# Patient Record
Sex: Male | Born: 1960 | Race: White | Hispanic: No | Marital: Married | Smoking: Former smoker
Health system: Southern US, Community
[De-identification: ages and names within clinical notes are randomized; demographics above are authoritative.]

## PROBLEM LIST (undated history)

## (undated) DIAGNOSIS — I10 Essential (primary) hypertension: Secondary | ICD-10-CM

## (undated) HISTORY — PX: APPENDECTOMY: SHX54

## (undated) HISTORY — PX: EYE SURGERY: SHX253

---

## 2014-12-02 ENCOUNTER — Encounter (INDEPENDENT_AMBULATORY_CARE_PROVIDER_SITE_OTHER): Payer: Self-pay | Admitting: *Deleted

## 2014-12-18 ENCOUNTER — Other Ambulatory Visit (INDEPENDENT_AMBULATORY_CARE_PROVIDER_SITE_OTHER): Payer: Self-pay | Admitting: *Deleted

## 2014-12-18 ENCOUNTER — Encounter (INDEPENDENT_AMBULATORY_CARE_PROVIDER_SITE_OTHER): Payer: Self-pay | Admitting: *Deleted

## 2014-12-18 DIAGNOSIS — Z1211 Encounter for screening for malignant neoplasm of colon: Secondary | ICD-10-CM

## 2015-01-11 ENCOUNTER — Telehealth (INDEPENDENT_AMBULATORY_CARE_PROVIDER_SITE_OTHER): Payer: Self-pay | Admitting: *Deleted

## 2015-01-11 NOTE — Telephone Encounter (Signed)
Patient needs suprep 

## 2015-01-12 MED ORDER — SUPREP BOWEL PREP KIT 17.5-3.13-1.6 GM/177ML PO SOLN: 1.0000 | Freq: Once | ORAL | Status: DC

## 2015-02-09 ENCOUNTER — Telehealth (INDEPENDENT_AMBULATORY_CARE_PROVIDER_SITE_OTHER): Payer: Self-pay | Admitting: *Deleted

## 2015-02-09 NOTE — Telephone Encounter (Signed)
Referring MD/PCP: howard   Procedure: tcs  Reason/Indication:  screening  Has patient had this procedure before?  no  If so, when, by whom and where?    Is there a family history of colon cancer?  no  Who?  What age when diagnosed?    Is patient diabetic?   no      Does patient have prosthetic heart valve?  no  Do you have a pacemaker?  no  Has patient ever had endocarditis? no  Has patient had joint replacement within last 12 months?  no  Does patient tend to be constipated or take laxatives? no  Does patient have a history of alcohol/drug use? o  Is patient on Coumadin, Plavix and/or Aspirin? no  Medications: lisinopril 10 mg daily, fildenafil 20 mg prn, tamsulosin 0.4 mg daily  Allergies: asa  Medication Adjustment:   Procedure date & time: 03/10/15 at 930

## 2015-02-11 NOTE — Telephone Encounter (Signed)
agree

## 2015-03-10 ENCOUNTER — Encounter (HOSPITAL_COMMUNITY): Payer: Self-pay | Admitting: *Deleted

## 2015-03-10 ENCOUNTER — Ambulatory Visit (HOSPITAL_COMMUNITY)
Admission: RE | Admit: 2015-03-10 | Discharge: 2015-03-10 | Disposition: A | Discharge status: Home / Self Care | Payer: No Typology Code available for payment source | Source: Ambulatory Visit | Attending: Internal Medicine | Admitting: Internal Medicine

## 2015-03-10 ENCOUNTER — Encounter (HOSPITAL_COMMUNITY)
Admission: RE | Disposition: A | Discharge status: Home / Self Care | Payer: Self-pay | Source: Ambulatory Visit | Attending: Internal Medicine

## 2015-03-10 DIAGNOSIS — K644 Residual hemorrhoidal skin tags: Secondary | ICD-10-CM | POA: Insufficient documentation

## 2015-03-10 DIAGNOSIS — I10 Essential (primary) hypertension: Secondary | ICD-10-CM | POA: Insufficient documentation

## 2015-03-10 DIAGNOSIS — Z808 Family history of malignant neoplasm of other organs or systems: Secondary | ICD-10-CM | POA: Insufficient documentation

## 2015-03-10 DIAGNOSIS — K648 Other hemorrhoids: Secondary | ICD-10-CM | POA: Diagnosis not present

## 2015-03-10 DIAGNOSIS — Z79899 Other long term (current) drug therapy: Secondary | ICD-10-CM | POA: Diagnosis not present

## 2015-03-10 DIAGNOSIS — K5730 Diverticulosis of large intestine without perforation or abscess without bleeding: Secondary | ICD-10-CM | POA: Insufficient documentation

## 2015-03-10 DIAGNOSIS — Z1211 Encounter for screening for malignant neoplasm of colon: Secondary | ICD-10-CM | POA: Diagnosis not present

## 2015-03-10 DIAGNOSIS — D125 Benign neoplasm of sigmoid colon: Secondary | ICD-10-CM | POA: Diagnosis not present

## 2015-03-10 HISTORY — DX: Essential (primary) hypertension: I10

## 2015-03-10 HISTORY — PX: COLONOSCOPY: SHX5424

## 2015-03-10 SURGERY — COLONOSCOPY
Anesthesia: Moderate Sedation

## 2015-03-10 MED ORDER — MEPERIDINE HCL 50 MG/ML IJ SOLN
INTRAMUSCULAR | Status: DC | PRN
  Administered 2015-03-10 (×2): 25 mg via INTRAVENOUS

## 2015-03-10 MED ORDER — MIDAZOLAM HCL 5 MG/5ML IJ SOLN
INTRAMUSCULAR | Status: AC
  Filled 2015-03-10: qty 10

## 2015-03-10 MED ORDER — SODIUM CHLORIDE 0.9 % IV SOLN
INTRAVENOUS | Status: DC
  Administered 2015-03-10: 1000 mL via INTRAVENOUS

## 2015-03-10 MED ORDER — SIMETHICONE 40 MG/0.6ML PO SUSP
ORAL | Status: DC | PRN
  Administered 2015-03-10: 09:00:00

## 2015-03-10 MED ORDER — MIDAZOLAM HCL 5 MG/5ML IJ SOLN
INTRAMUSCULAR | Status: DC | PRN
  Administered 2015-03-10 (×4): 2 mg via INTRAVENOUS

## 2015-03-10 MED ORDER — MEPERIDINE HCL 50 MG/ML IJ SOLN
INTRAMUSCULAR | Status: AC
  Filled 2015-03-10: qty 1

## 2015-03-10 NOTE — H&P (Signed)
Peter Underwood. is an 54 y.o. male.   Chief Complaint: Patient is here for colonoscopy. HPI: Patient is 54 year old Caucasian male who is here for screening colonoscopy. He denies abdominal pain change in bowel habits rectal bleeding. This is patient's first exam. Family history is negative for CRC.  Past Medical History  Diagnosis Date  . Hypertension     Past Surgical History  Procedure Laterality Date  . Appendectomy      Family History  Problem Relation Age of Onset  . Hypertension Mother   . Alzheimer's disease Mother   . Diabetes Father   . Hypertension Father   . Arthritis Father   . Brain cancer Brother    Social History:  reports that he has quit smoking. He uses smokeless tobacco. He reports that he drinks alcohol. He reports that he does not use illicit drugs.  Allergies: No Known Allergies  Medications Prior to Admission  Medication Sig Dispense Refill  . lisinopril (PRINIVIL,ZESTRIL) 10 MG tablet Take 10 mg by mouth daily.  1  . SUPREP BOWEL PREP SOLN Take 1 kit by mouth once. 1 Bottle 0  . tamsulosin (FLOMAX) 0.4 MG CAPS capsule Take 0.4 mg by mouth daily.  4    No results found for this or any previous visit (from the past 48 hour(s)). No results found.  ROS  Blood pressure 113/72, pulse 66, temperature 98.2 F (36.8 C), temperature source Oral, resp. rate 11, height _0  (1.778 m), weight 220 lb (99.791 kg), SpO2 100 %. Physical Exam  Constitutional: He appears well-developed and well-nourished.  HENT:  Mouth/Throat: Oropharynx is clear and moist.  Eyes: Conjunctivae are normal. No scleral icterus.  Neck: No thyromegaly present.  Cardiovascular: Normal rate, regular rhythm and normal heart sounds.   No murmur heard. Respiratory: Effort normal and breath sounds normal.  GI: Soft. He exhibits no distension and no mass. There is no tenderness.  Musculoskeletal: He exhibits no edema.  Lymphadenopathy:    He has no cervical adenopathy.   Neurological: He is alert.  Skin: Skin is warm and dry.     Assessment/Plan Average risk screening colonoscopy.  REHMAN,NAJEEB U 03/10/2015, 9:20 AM

## 2015-03-10 NOTE — Op Note (Signed)
COLONOSCOPY PROCEDURE REPORT  PATIENT:  Peter Underwood.  MR#:  [id number] Birthdate:  January 19, 1961, 54 y.o., male Endoscopist:  Dr. Rogene Houston, MD Referred By:  Dr. Rory Percy, MD  Procedure Date: 03/10/2015  Procedure:   Colonoscopy  Indications:  Average risk screening colonoscopy.  Informed Consent:  The procedure and risks were reviewed with the patient and informed consent was obtained.  Medications:  Demerol 50 mg IV Versed 8 mg IV  Description of procedure:  After a digital rectal exam was performed, that colonoscope was advanced from the anus through the rectum and colon to the area of the cecum, ileocecal valve and appendiceal orifice. The cecum was deeply intubated. These structures were well-seen and photographed for the record. From the level of the cecum and ileocecal valve, the scope was slowly and cautiously withdrawn. The mucosal surfaces were carefully surveyed utilizing scope tip to flexion to facilitate fold flattening as needed. The scope was pulled down into the rectum where a thorough exam including retroflexion was performed.  Findings:   Prep satisfactory. Small polyp cold snared from proximal sigmoid colon. Single small diverticulum at sigmoid colon. Normal rectal mucosa. Small hemorrhoids below the dentate line.  Therapeutic/Diagnostic Maneuvers Performed:  See above  Complications:  None  EBL: None  Cecal Withdrawal Time:  17 minutes  Impression:  Examination performed to cecum. Small polyp cold snared from proximal sigmoid colon. Single small diverticulum at sigmoid colon. Small external hemorrhoids  Recommendations:  Standard instructions given. I will contact patient with biopsy results and further recommendations.  REHMAN,NAJEEB U  03/10/2015 10:04 AM  CC: Dr. Rory Percy, MD & Dr. Rayne Du ref. provider found

## 2015-03-10 NOTE — Discharge Instructions (Signed)
Resume usual diet and medications. No driving for 24 hours. Physician will call with biopsy results.    Colonoscopy, Care After These instructions give you information on caring for yourself after your procedure. Your doctor may also give you more specific instructions. Call your doctor if you have any problems or questions after your procedure. HOME CARE  Do not drive for 24 hours.  Do not sign important papers or use machinery for 24 hours.  You may shower.  You may go back to your usual activities, but go slower for the first 24 hours.  Take rest breaks often during the first 24 hours.  Walk around or use warm packs on your belly (abdomen) if you have belly cramping or gas.  Drink enough fluids to keep your pee (urine) clear or pale yellow.  Resume your normal diet. Avoid heavy or fried foods.  Avoid drinking alcohol for 24 hours or as told by your doctor.  Only take medicines as told by your doctor. If a tissue sample (biopsy) was taken during the procedure:   Do not take aspirin or blood thinners for 7 days, or as told by your doctor.  Do not drink alcohol for 7 days, or as told by your doctor.  Eat soft foods for the first 24 hours. GET HELP IF: You still have a small amount of blood in your poop (stool) 2-3 days after the procedure. GET HELP RIGHT AWAY IF:  You have more than a small amount of blood in your poop.  You see clumps of tissue (blood clots) in your poop.  Your belly is puffy (swollen).  You feel sick to your stomach (nauseous) or throw up (vomit).  You have a fever.  You have belly pain that gets worse and medicine does not help. MAKE SURE YOU:  Understand these instructions.  Will watch your condition.  Will get help right away if you are not doing well or get worse. Document Released: 07/01/2010 Document Revised: 06/03/2013 Document Reviewed: 02/03/2013 [facility] Patient Information 2015 [place], [place]. This information is not intended  to replace advice given to you by your health care provider. Make sure you discuss any questions you have with your health care provider.   Hemorrhoids Hemorrhoids are swollen veins around the rectum or anus. There are two types of hemorrhoids:   Internal hemorrhoids. These occur in the veins just inside the rectum. They may poke through to the outside and become irritated and painful.  External hemorrhoids. These occur in the veins outside the anus and can be felt as a painful swelling or hard lump near the anus. CAUSES  Pregnancy.   Obesity.   Constipation or diarrhea.   Straining to have a bowel movement.   Sitting for long periods on the toilet.  Heavy lifting or other activity that caused you to strain.  Anal intercourse. SYMPTOMS   Pain.   Anal itching or irritation.   Rectal bleeding.   Fecal leakage.   Anal swelling.   One or more lumps around the anus.  DIAGNOSIS  Your caregiver may be able to diagnose hemorrhoids by visual examination. Other examinations or tests that may be performed include:   Examination of the rectal area with a gloved hand (digital rectal exam).   Examination of anal canal using a small tube (scope).   A blood test if you have lost a significant amount of blood.  A test to look inside the colon (sigmoidoscopy or colonoscopy). TREATMENT Most hemorrhoids can be treated at  home. However, if symptoms do not seem to be getting better or if you have a lot of rectal bleeding, your caregiver may perform a procedure to help make the hemorrhoids get smaller or remove them completely. Possible treatments include:   Placing a rubber band at the base of the hemorrhoid to cut off the circulation (rubber band ligation).   Injecting a chemical to shrink the hemorrhoid (sclerotherapy).   Using a tool to burn the hemorrhoid (infrared light therapy).   Surgically removing the hemorrhoid (hemorrhoidectomy).   Stapling the  hemorrhoid to block blood flow to the tissue (hemorrhoid stapling).  HOME CARE INSTRUCTIONS   Eat foods with fiber, such as whole grains, beans, nuts, fruits, and vegetables. Ask your doctor about taking products with added fiber in them (fibersupplements).  Increase fluid intake. Drink enough water and fluids to keep your urine clear or pale yellow.   Exercise regularly.   Go to the bathroom when you have the urge to have a bowel movement. Do not wait.   Avoid straining to have bowel movements.   Keep the anal area dry and clean. Use wet toilet paper or moist towelettes after a bowel movement.   Medicated creams and suppositories may be used or applied as directed.   Only take over-the-counter or prescription medicines as directed by your caregiver.   Take warm sitz baths for 15-20 minutes, 3-4 times a day to ease pain and discomfort.   Place ice packs on the hemorrhoids if they are tender and swollen. Using ice packs between sitz baths may be helpful.   Put ice in a plastic bag.   Place a towel between your skin and the bag.   Leave the ice on for 15-20 minutes, 3-4 times a day.   Do not use a donut-shaped pillow or sit on the toilet for long periods. This increases blood pooling and pain.  SEEK MEDICAL CARE IF:  You have increasing pain and swelling that is not controlled by treatment or medicine.  You have uncontrolled bleeding.  You have difficulty or you are unable to have a bowel movement.  You have pain or inflammation outside the area of the hemorrhoids. MAKE SURE YOU:  Understand these instructions.  Will watch your condition.  Will get help right away if you are not doing well or get worse. Document Released: 05/26/2000 Document Revised: 05/15/2012 Document Reviewed: 04/02/2012 [facility] Patient Information 2015 [place], [place]. This information is not intended to replace advice given to you by your health care provider. Make sure you discuss  any questions you have with your health care provider.   Colon Polyps Polyps are lumps of extra tissue growing inside the body. Polyps can grow in the large intestine (colon). Most colon polyps are noncancerous (benign). However, some colon polyps can become cancerous over time. Polyps that are larger than a pea may be harmful. To be safe, caregivers remove and test all polyps. CAUSES  Polyps form when mutations in the genes cause your cells to grow and divide even though no more tissue is needed. RISK FACTORS There are a number of risk factors that can increase your chances of getting colon polyps. They include:  Being older than 50 years.  Family history of colon polyps or colon cancer.  Long-term colon diseases, such as colitis or Crohn disease.  Being overweight.  Smoking.  Being inactive.  Drinking too much alcohol. SYMPTOMS  Most small polyps do not cause symptoms. If symptoms are present, they may include:  Blood in the stool. The stool may look dark red or black.  Constipation or diarrhea that lasts longer than 1 week. DIAGNOSIS People often do not know they have polyps until their caregiver finds them during a regular checkup. Your caregiver can use 4 tests to check for polyps:  Digital rectal exam. The caregiver wears gloves and feels inside the rectum. This test would find polyps only in the rectum.  Barium enema. The caregiver puts a liquid called barium into your rectum before taking X-rays of your colon. Barium makes your colon look white. Polyps are dark, so they are easy to see in the X-ray pictures.  Sigmoidoscopy. A thin, flexible tube (sigmoidoscope) is placed into your rectum. The sigmoidoscope has a light and tiny camera in it. The caregiver uses the sigmoidoscope to look at the last third of your colon.  Colonoscopy. This test is like sigmoidoscopy, but the caregiver looks at the entire colon. This is the most common method for finding and removing  polyps. TREATMENT  Any polyps will be removed during a sigmoidoscopy or colonoscopy. The polyps are then tested for cancer. PREVENTION  To help lower your risk of getting more colon polyps:  Eat plenty of fruits and vegetables. Avoid eating fatty foods.  Do not smoke.  Avoid drinking alcohol.  Exercise every day.  Lose weight if recommended by your caregiver.  Eat plenty of calcium and folate. Foods that are rich in calcium include milk, cheese, and broccoli. Foods that are rich in folate include chickpeas, kidney beans, and spinach. HOME CARE INSTRUCTIONS Keep all follow-up appointments as directed by your caregiver. You may need periodic exams to check for polyps. SEEK MEDICAL CARE IF: You notice bleeding during a bowel movement. Document Released: 02/23/2004 Document Revised: 08/21/2011 Document Reviewed: 08/08/2011 [facility] Patient Information 2015 [place], [place]. This information is not intended to replace advice given to you by your health care provider. Make sure you discuss any questions you have with your health care provider.

## 2015-03-17 ENCOUNTER — Encounter (HOSPITAL_COMMUNITY): Payer: Self-pay | Admitting: Internal Medicine

## 2018-02-15 ENCOUNTER — Encounter: Payer: Self-pay | Admitting: *Deleted

## 2018-02-18 ENCOUNTER — Encounter: Payer: Self-pay | Admitting: *Deleted

## 2018-02-18 ENCOUNTER — Ambulatory Visit (INDEPENDENT_AMBULATORY_CARE_PROVIDER_SITE_OTHER): Payer: No Typology Code available for payment source | Admitting: Cardiovascular Disease

## 2018-02-18 ENCOUNTER — Telehealth: Payer: Self-pay | Admitting: Cardiovascular Disease

## 2018-02-18 ENCOUNTER — Encounter: Payer: Self-pay | Admitting: Cardiovascular Disease

## 2018-02-18 VITALS — BP 118/80 | HR 81 | Ht 70.5 in | Wt 225.0 lb

## 2018-02-18 DIAGNOSIS — R55 Syncope and collapse: Secondary | ICD-10-CM | POA: Diagnosis not present

## 2018-02-18 DIAGNOSIS — R5383 Other fatigue: Secondary | ICD-10-CM | POA: Diagnosis not present

## 2018-02-18 DIAGNOSIS — R002 Palpitations: Secondary | ICD-10-CM | POA: Diagnosis not present

## 2018-02-18 NOTE — Addendum Note (Signed)
Addended by: Laurine Blazer on: 02/18/2018 11:05 AM   Modules accepted: Orders

## 2018-02-18 NOTE — Patient Instructions (Addendum)
Medication Instructions:  Continue all current medications.  Labwork: none  Testing/Procedures: Your physician has requested that you have an exercise stress myoview. For further information please visit [email or web address]. Please follow instruction sheet, as given.  Your physician has recommended that you wear a 30 day event monitor. Event monitors are medical devices that record the heart's electrical activity. Doctors most often [place] these monitors to diagnose arrhythmias. Arrhythmias are problems with the speed or rhythm of the heartbeat. The monitor is a small, portable device. You can wear one while you do your normal daily activities. This is usually used to diagnose what is causing palpitations/syncope (passing out).  Office will contact with results via phone or letter.    Follow-Up: 3 months or next available   Any Other Special Instructions Will Be Listed Below (If Applicable).  If you need a refill on your cardiac medications before your next appointment, please call your pharmacy.

## 2018-02-18 NOTE — Telephone Encounter (Signed)
°  Precert needed for: Exercise MV - near syncope, palp    Location: [facility]    Date:  Sept 13, 2019

## 2018-02-18 NOTE — Telephone Encounter (Signed)
°  Precert needed for:   *30 day event - near syncope, palp (PRE-CERT)   Location:     Date:

## 2018-02-18 NOTE — Progress Notes (Signed)
CARDIOLOGY CONSULT NOTE  Patient ID: Peter Underwood. MRN: [id number] DOB/AGE: 1960/08/23 57 y.o.  Admit date: (Not on file) Primary Physician: Rory Percy, MD Referring Physician: Ricardo Jericho PA  Reason for Consultation: Near syncope and palpitations  HPI: Peter Underwood. is a 57 y.o. male who is being seen today for the evaluation of near syncope and palpitations at the request of Lavella Lemons, [place].   I reviewed notes from his PCP.  There are comments about palpitations and near syncope.  He had some dizziness.  I personally reviewed an ECG recently performed by his PCP which demonstrated sinus rhythm with no ischemic ST segment or T wave abnormalities, nor any arrhythmias.  I reviewed labs dated 01/16/2018: Glucose 112, BUN 15, creatinine 0.93, sodium 142, potassium 4.8, white blood cells 5.6, hemoglobin 15.8, platelets 287, total cholesterol 178, triglycerides 132, HDL 31, LDL 121, hemoglobin A1c 6.9%.  He is here with his wife who is also my patient.  He tells me that symptoms have not been significant over the past 2 weeks.  He described an event 4 weeks ago when he was standing at work and not doing anything strenuous and suddenly felt like his face was hot.  He became dizzy and felt like his heart was racing for about 15 to 20 seconds.  His blood pressure was checked at work and was found to be okay.  He denies associated chest pain and shortness of breath.  He has felt mildly decreased energy levels over the past few years.  He has had occasional left arm pain but wonders if it is due to his left shoulder orthopedic issues.  He has noticed his blood pressure has been lower over the past 2 years.  He has cut back lisinopril to 2.5 mg daily on his own.  He is a Freight forwarder by profession.    No Known Allergies  Current Outpatient Medications  Medication Sig Dispense Refill  . dutasteride (AVODART) 0.5 MG capsule Take 0.5 mg by mouth daily.    Marland Kitchen lisinopril  (PRINIVIL,ZESTRIL) 10 MG tablet Take 2.5 mg by mouth daily.   1  . sildenafil (REVATIO) 20 MG tablet Take 20 mg by mouth.    . tamsulosin (FLOMAX) 0.4 MG CAPS capsule Take 0.4 mg by mouth daily.  4   No current facility-administered medications for this visit.     Past Medical History:  Diagnosis Date  . Hypertension     Past Surgical History:  Procedure Laterality Date  . APPENDECTOMY    . COLONOSCOPY N/A 03/10/2015   Procedure: COLONOSCOPY;  Surgeon: Rogene Houston, MD;  Location: [facility];  Service: Endoscopy;  Laterality: N/A;  930    Social History   Socioeconomic History  . Marital status: Married    Spouse name: Not on file  . Number of children: Not on file  . Years of education: Not on file  . Highest education level: Not on file  Occupational History  . Not on file  Social Needs  . Financial resource strain: Not on file  . Food insecurity:    Worry: Not on file    Inability: Not on file  . Transportation needs:    Medical: Not on file    Non-medical: Not on file  Tobacco Use  . Smoking status: Former Research scientist (life sciences)  . Smokeless tobacco: Current User  Substance and Sexual Activity  . Alcohol use: Yes    Comment: occasional beer  .  Drug use: No  . Sexual activity: Not on file  Lifestyle  . Physical activity:    Days per week: Not on file    Minutes per session: Not on file  . Stress: Not on file  Relationships  . Social connections:    Talks on phone: Not on file    Gets together: Not on file    Attends religious service: Not on file    Active member of club or organization: Not on file    Attends meetings of clubs or organizations: Not on file    Relationship status: Not on file  . Intimate partner violence:    Fear of current or ex partner: Not on file    Emotionally abused: Not on file    Physically abused: Not on file    Forced sexual activity: Not on file  Other Topics Concern  . Not on file  Social History Narrative  . Not on file      No family history of premature CAD in 1st degree relatives.  Current Meds  Medication Sig  . dutasteride (AVODART) 0.5 MG capsule Take 0.5 mg by mouth daily.  Marland Kitchen lisinopril (PRINIVIL,ZESTRIL) 10 MG tablet Take 2.5 mg by mouth daily.   . sildenafil (REVATIO) 20 MG tablet Take 20 mg by mouth.  . tamsulosin (FLOMAX) 0.4 MG CAPS capsule Take 0.4 mg by mouth daily.      Review of systems complete and found to be negative unless listed above in HPI    Physical exam Blood pressure 118/80, pulse 81, height 5' 10.5" (1.791 m), weight 225 lb (102.1 kg), SpO2 98 %. General: NAD Neck: No JVD, no thyromegaly or thyroid nodule.  Lungs: Clear to auscultation bilaterally with normal respiratory effort. CV: Nondisplaced PMI. Regular rate and rhythm, normal S1/S2, no S3/S4, no murmur.  No peripheral edema.  No carotid bruit.    Abdomen: Soft, nontender, no distention.  Skin: Intact without lesions or rashes.  Neurologic: Alert and oriented x 3.  Psych: Normal affect. Extremities: No clubbing or cyanosis.  HEENT: Normal.   ECG: Most recent ECG reviewed.   Labs: No results found for: K, BUN, CREATININE, ALT, TSH, HGB   Lipids: No results found for: LDLCALC, LDLDIRECT, CHOL, TRIG, HDL      ASSESSMENT AND PLAN:  1.  Near syncope and fatigue: Unclear etiology at this time.  Blood pressure is normal and he is only taking 2.5 mg of lisinopril.  I recommended he stop this as it is not likely causing any significant benefit.  I asked him to continue to monitor blood pressures at home.  I will obtain an exercise Myoview stress test for further clarification.  2.  Palpitations: I will obtain a 1 month event monitor.  3.  Hypertension: See discussion in #1.  I will have him stop lisinopril 2.5 mg daily and monitor blood pressures at home.   Disposition: Follow up in 3 months    Signed: Kate Sable, M.D., F.A.C.C.  02/18/2018, 10:19 AM

## 2018-02-22 ENCOUNTER — Ambulatory Visit (INDEPENDENT_AMBULATORY_CARE_PROVIDER_SITE_OTHER): Payer: No Typology Code available for payment source

## 2018-02-22 ENCOUNTER — Encounter (HOSPITAL_COMMUNITY)
Admission: RE | Admit: 2018-02-22 | Discharge: 2018-02-22 | Disposition: A | Discharge status: Home / Self Care | Payer: No Typology Code available for payment source | Source: Ambulatory Visit | Attending: Cardiovascular Disease | Admitting: Cardiovascular Disease

## 2018-02-22 ENCOUNTER — Encounter (HOSPITAL_BASED_OUTPATIENT_CLINIC_OR_DEPARTMENT_OTHER)
Admission: RE | Admit: 2018-02-22 | Discharge: 2018-02-22 | Disposition: A | Discharge status: Home / Self Care | Payer: No Typology Code available for payment source | Source: Ambulatory Visit | Attending: Cardiovascular Disease | Admitting: Cardiovascular Disease

## 2018-02-22 DIAGNOSIS — R002 Palpitations: Secondary | ICD-10-CM

## 2018-02-22 DIAGNOSIS — R55 Syncope and collapse: Secondary | ICD-10-CM | POA: Insufficient documentation

## 2018-02-22 DIAGNOSIS — R9439 Abnormal result of other cardiovascular function study: Secondary | ICD-10-CM | POA: Insufficient documentation

## 2018-02-22 LAB — NM MYOCAR MULTI W/SPECT W/WALL MOTION / EF
CHL RATE OF PERCEIVED EXERTION: 13
Estimated workload: 10.1 METS
Exercise duration (min): 9 min
Exercise duration (sec): 4 s
LHR: 0.32
LV dias vol: 92 mL (ref 62–150)
LVSYSVOL: 48 mL
MPHR: 162 {beats}/min
NUC STRESS TID: 0.8
Peak HR: 141 {beats}/min
Percent HR: 87 %
Rest HR: 70 {beats}/min
SDS: 5
SRS: 2
SSS: 7

## 2018-02-22 MED ORDER — SODIUM CHLORIDE 0.9% FLUSH
INTRAVENOUS | Status: AC
  Administered 2018-02-22: 10 mL via INTRAVENOUS
  Filled 2018-02-22: qty 10

## 2018-02-22 MED ORDER — REGADENOSON 0.4 MG/5ML IV SOLN
INTRAVENOUS | Status: AC
  Filled 2018-02-22: qty 5

## 2018-02-22 MED ORDER — TECHNETIUM TC 99M TETROFOSMIN IV KIT
10.0000 | PACK | Freq: Once | INTRAVENOUS | Status: AC | PRN
  Administered 2018-02-22: 10 via INTRAVENOUS

## 2018-02-22 MED ORDER — TECHNETIUM TC 99M TETROFOSMIN IV KIT
30.0000 | PACK | Freq: Once | INTRAVENOUS | Status: AC | PRN
  Administered 2018-02-22: 30.3 via INTRAVENOUS

## 2018-02-25 ENCOUNTER — Telehealth: Payer: Self-pay

## 2018-02-25 ENCOUNTER — Ambulatory Visit: Payer: No Typology Code available for payment source | Admitting: Cardiovascular Disease

## 2018-02-25 MED ORDER — ATORVASTATIN CALCIUM 40 MG PO TABS: 40.0000 mg | ORAL_TABLET | Freq: Every day | ORAL | 3 refills | Status: DC

## 2018-02-25 NOTE — Telephone Encounter (Signed)
-----   Message from Herminio Commons, MD sent at 02/22/2018  3:31 PM EDT ----- Stress test suggestive of a blockage. Start ASA 81 mg daily and atorvastatin 40 mg daily. Have him see me 4:20 on 10/1 to discuss next steps.

## 2018-02-25 NOTE — Telephone Encounter (Signed)
Pt notified of test results,f/u apt and to start ASA and Atorvastatin

## 2018-03-12 ENCOUNTER — Ambulatory Visit: Payer: No Typology Code available for payment source | Admitting: Cardiovascular Disease

## 2018-03-26 ENCOUNTER — Encounter: Payer: Self-pay | Admitting: Cardiovascular Disease

## 2018-03-26 ENCOUNTER — Ambulatory Visit: Payer: No Typology Code available for payment source | Admitting: Cardiovascular Disease

## 2018-03-26 VITALS — BP 120/82 | HR 90 | Ht 70.0 in | Wt 221.0 lb

## 2018-03-26 DIAGNOSIS — R5383 Other fatigue: Secondary | ICD-10-CM | POA: Diagnosis not present

## 2018-03-26 DIAGNOSIS — R55 Syncope and collapse: Secondary | ICD-10-CM

## 2018-03-26 DIAGNOSIS — R002 Palpitations: Secondary | ICD-10-CM

## 2018-03-26 DIAGNOSIS — R9439 Abnormal result of other cardiovascular function study: Secondary | ICD-10-CM | POA: Diagnosis not present

## 2018-03-26 NOTE — Patient Instructions (Signed)
Medication Instructions:  Your physician recommends that you continue on your current medications as directed. Please refer to the Current Medication list given to you today.   Labwork: none  Testing/Procedures: none  Follow-Up: Your physician recommends that you schedule a follow-up appointment in: [place]    Any Other Special Instructions Will Be Listed Below (If Applicable).     If you need a refill on your cardiac medications before your next appointment, please call your pharmacy.

## 2018-03-26 NOTE — Progress Notes (Signed)
SUBJECTIVE: The patient returns for follow-up after undergoing cardiovascular testing performed for the evaluation of near syncope and fatigue.  Nuclear stress test 02/22/2018 demonstrated a hypertensive response to exercise.  There was a defect in the mid inferior and apical inferior locations consistent with a small prior myocardial infarction with moderate peri-infarct ischemia, LVEF 48%.  It was deemed an intermediate risk study.  I had him start aspirin 81 mg and atorvastatin 40 mg.  He has been monitoring his blood pressures at home off of lisinopril and noticed that they were increasing and started taking 5 mg daily.  Since then his blood pressures have normalized.  He denies chest pain, palpitations, and exertional dyspnea.  He walked 2 miles last night without difficulty.  He denies any further near syncopal episodes.   He is here with his wife who is also my patient.  He is a Freight forwarder by profession.  Review of Systems: As per "subjective", otherwise negative.  No Known Allergies  Current Outpatient Medications  Medication Sig Dispense Refill  . atorvastatin (LIPITOR) 40 MG tablet Take 1 tablet (40 mg total) by mouth daily. 90 tablet 3  . dutasteride (AVODART) 0.5 MG capsule Take 0.5 mg by mouth daily.    Marland Kitchen lisinopril (PRINIVIL,ZESTRIL) 10 MG tablet Take 5 mg by mouth daily.   1  . sildenafil (REVATIO) 20 MG tablet Take 20 mg by mouth.    . tamsulosin (FLOMAX) 0.4 MG CAPS capsule Take 0.4 mg by mouth daily.  4   No current facility-administered medications for this visit.     Past Medical History:  Diagnosis Date  . Hypertension     Past Surgical History:  Procedure Laterality Date  . APPENDECTOMY    . COLONOSCOPY N/A 03/10/2015   Procedure: COLONOSCOPY;  Surgeon: Rogene Houston, MD;  [facility];  Service: Endoscopy;  Laterality: N/A;  930    Social History   Socioeconomic History  . Marital status: Married    Spouse name: Not on file  .  Number of children: Not on file  . Years of education: Not on file  . Highest education level: Not on file  Occupational History  . Not on file  Social Needs  . Financial resource strain: Not on file  . Food insecurity:    Worry: Not on file    Inability: Not on file  . Transportation needs:    Medical: Not on file    Non-medical: Not on file  Tobacco Use  . Smoking status: Former Research scientist (life sciences)  . Smokeless tobacco: Current User  Substance and Sexual Activity  . Alcohol use: Yes    Comment: occasional beer  . Drug use: No  . Sexual activity: Not on file  Lifestyle  . Physical activity:    Days per week: Not on file    Minutes per session: Not on file  . Stress: Not on file  Relationships  . Social connections:    Talks on phone: Not on file    Gets together: Not on file    Attends religious service: Not on file    Active member of club or organization: Not on file    Attends meetings of clubs or organizations: Not on file    Relationship status: Not on file  . Intimate partner violence:    Fear of current or ex partner: Not on file    Emotionally abused: Not on file    Physically abused: Not on file  Forced sexual activity: Not on file  Other Topics Concern  . Not on file  Social History Narrative  . Not on file     Vitals:   03/26/18 1540  BP: 120/82  Pulse: 90  SpO2: 96%  Weight: 221 lb (100.2 kg)  Height: 5\' 10"  (1.778 m)    Wt Readings from Last 3 Encounters:  03/26/18 221 lb (100.2 kg)  02/18/18 225 lb (102.1 kg)  03/10/15 220 lb (99.8 kg)     PHYSICAL EXAM General: NAD HEENT: Normal. Neck: No JVD, no thyromegaly. Lungs: Clear to auscultation bilaterally with normal respiratory effort. CV: Regular rate and rhythm, normal S1/S2, no S3/S4, no murmur. No pretibial or periankle edema.    Abdomen: Soft, nontender, no distention.  Neurologic: Alert and oriented.  Psych: Normal affect. Skin: Normal. Musculoskeletal: No gross deformities.    ECG:  Reviewed above under Subjective   Labs: No results found for: K, BUN, CREATININE, ALT, TSH, HGB   Lipids: No results found for: LDLCALC, LDLDIRECT, CHOL, TRIG, HDL     ASSESSMENT AND PLAN:  1.  Near syncope and fatigue: No symptom recurrence.  Nuclear stress test reviewed above suggestive of ischemic heart disease.  Currently on aspirin 81 mg and atorvastatin 40 mg.  I will continue to monitor symptoms.  If he develops worsening fatigue or has chest pain/shortness of breath, I would then arrange for coronary angiography.  2.  Palpitations: Awaiting results of 1 month event monitor.  3.  Hypertension: Blood pressure is normal.  Continue lisinopril 5 mg daily.   Disposition: Follow up 3 months in [facility] office   Kate Sable, M.D., F.A.C.C.

## 2018-04-25 ENCOUNTER — Telehealth: Payer: Self-pay | Admitting: *Deleted

## 2018-04-25 NOTE — Telephone Encounter (Signed)
Notes recorded by Laurine Blazer, LPN on 08/31/2246 at 9:27 AM EST Wife Manuela Schwartz) notified. Copy to pmd. Follow up scheduled for 07/2018 with Dr. Bronson Ing. ------  Notes recorded by Herminio Commons, MD on 04/24/2018 at 12:42 PM EST No significant arrhythmias.

## 2018-05-28 ENCOUNTER — Ambulatory Visit: Payer: No Typology Code available for payment source | Admitting: Cardiovascular Disease

## 2018-07-17 ENCOUNTER — Ambulatory Visit: Payer: No Typology Code available for payment source | Admitting: Cardiovascular Disease

## 2018-07-17 ENCOUNTER — Encounter: Payer: Self-pay | Admitting: Cardiovascular Disease

## 2018-07-17 VITALS — BP 114/73 | HR 67 | Ht 70.0 in | Wt 220.4 lb

## 2018-07-17 DIAGNOSIS — R55 Syncope and collapse: Secondary | ICD-10-CM

## 2018-07-17 DIAGNOSIS — R002 Palpitations: Secondary | ICD-10-CM

## 2018-07-17 DIAGNOSIS — R5383 Other fatigue: Secondary | ICD-10-CM

## 2018-07-17 DIAGNOSIS — R9439 Abnormal result of other cardiovascular function study: Secondary | ICD-10-CM | POA: Diagnosis not present

## 2018-07-17 DIAGNOSIS — E119 Type 2 diabetes mellitus without complications: Secondary | ICD-10-CM

## 2018-07-17 NOTE — Patient Instructions (Signed)
Medication Instructions:   Stop Lipitor  Continue all other medications.    Labwork: none  Testing/Procedures: none  Follow-Up: Your physician wants you to follow up in: 6 months.  You will receive a reminder letter in the mail one-two months in advance.  If you don't receive a letter, please call our office to schedule the follow up appointment   Any Other Special Instructions Will Be Listed Below (If Applicable).  If you need a refill on your cardiac medications before your next appointment, please call your pharmacy.

## 2018-07-17 NOTE — Progress Notes (Signed)
SUBJECTIVE: The patient presents for routine follow-up.  In summary, he had been experiencing near syncope and fatigue.  Nuclear stress test 02/22/2018 demonstrated a hypertensive response to exercise.  There was a defect in the mid inferior and apical inferior locations consistent with a small prior myocardial infarction with moderate peri-infarct ischemia, LVEF 48%.  It was deemed an intermediate risk study.   I had him start aspirin 81 mg and atorvastatin 40 mg.  He denies chest pain, palpitations, leg swelling, and shortness of breath.  He had been experiencing fatigue and an aching sensation in his bones and for the past week reduce his atorvastatin to 20 mg daily.  He feels symptoms have improved to some degree.  He is here with his wife.  He was told by his PCP that his A1c is 7.4%.  He is trying to cut back on breads and sweets and wants to join a gym.   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  Review of Systems: As per "subjective", otherwise negative.  No Known Allergies  Current Outpatient Medications  Medication Sig Dispense Refill  . aspirin EC 81 MG tablet Take 81 mg by mouth daily.    Marland Kitchen atorvastatin (LIPITOR) 40 MG tablet Take 1 tablet (40 mg total) by mouth daily. 90 tablet 3  . dutasteride (AVODART) 0.5 MG capsule Take 0.5 mg by mouth daily.    Marland Kitchen lisinopril (PRINIVIL,ZESTRIL) 10 MG tablet Take 5 mg by mouth daily.   1  . sildenafil (REVATIO) 20 MG tablet Take 20 mg by mouth.    . tamsulosin (FLOMAX) 0.4 MG CAPS capsule Take 0.4 mg by mouth daily.  4   No current facility-administered medications for this visit.     Past Medical History:  Diagnosis Date  . Hypertension     Past Surgical History:  Procedure Laterality Date  . APPENDECTOMY    . COLONOSCOPY N/A 03/10/2015   Procedure: COLONOSCOPY;  Surgeon: Rogene Houston, MD;  Location: [facility];  Service: Endoscopy;  Laterality: N/A;  930    Social History     Socioeconomic History  . Marital status: Married    Spouse name: Not on file  . Number of children: Not on file  . Years of education: Not on file  . Highest education level: Not on file  Occupational History  . Not on file  Social Needs  . Financial resource strain: Not on file  . Food insecurity:    Worry: Not on file    Inability: Not on file  . Transportation needs:    Medical: Not on file    Non-medical: Not on file  Tobacco Use  . Smoking status: Former Research scientist (life sciences)  . Smokeless tobacco: Current User    Types: Chew  Substance and Sexual Activity  . Alcohol use: Yes    Comment: occasional beer  . Drug use: No  . Sexual activity: Not on file  Lifestyle  . Physical activity:    Days per week: Not on file    Minutes per session: Not on file  . Stress: Not on file  Relationships  . Social connections:    Talks on phone: Not on file    Gets together: Not on file    Attends religious service: Not on file    Active member of club or organization: Not on file    Attends meetings of clubs or organizations: Not on file  Relationship status: Not on file  . Intimate partner violence:    Fear of current or ex partner: Not on file    Emotionally abused: Not on file    Physically abused: Not on file    Forced sexual activity: Not on file  Other Topics Concern  . Not on file  Social History Narrative  . Not on file     Vitals:   07/17/18 0757  BP: 114/73  Pulse: 67  SpO2: 98%  Weight: 220 lb 6.4 oz (100 kg)  Height: 5\' 10"  (1.778 m)    Wt Readings from Last 3 Encounters:  07/17/18 220 lb 6.4 oz (100 kg)  03/26/18 221 lb (100.2 kg)  02/18/18 225 lb (102.1 kg)     PHYSICAL EXAM General: NAD HEENT: Normal. Neck: No JVD, no thyromegaly. Lungs: Clear to auscultation bilaterally with normal respiratory effort. CV: Regular rate and rhythm, normal S1/S2, no S3/S4, no murmur. No pretibial or periankle edema.  No carotid bruit.   Abdomen: Soft, nontender, no  distention.  Neurologic: Alert and oriented.  Psych: Normal affect. Skin: Normal. Musculoskeletal: No gross deformities.    ECG: Reviewed above under Subjective   Labs: No results found for: K, BUN, CREATININE, ALT, TSH, HGB   Lipids: No results found for: LDLCALC, LDLDIRECT, CHOL, TRIG, HDL     ASSESSMENT AND PLAN:  1. Near syncope and fatigue: No symptom recurrence.  Nuclear stress test reviewed above suggestive of ischemic heart disease.  Currently on aspirin 81 mg and atorvastatin 40 mg.  However, due to fatigue and an aching sensation in his bones, he has cut back atorvastatin to 20 mg for the past week.  I will discontinue it and consider challenging him with an alternate statin at his next visit. I will continue to monitor symptoms.  If he develops worsening fatigue or has chest pain/shortness of breath, I would then arrange for coronary angiography.  2. Palpitations:  No significant arrhythmias with event monitoring.  3. Hypertension: Blood pressure is normal.  Continue lisinopril 5 mg daily.  4.  Type 2 diabetes mellitus: A1c reportedly 7.4%.  I encouraged him to get strict about his diet and to definitely begin exercising for weight loss.   Disposition: Follow up 6 months   Kate Sable, M.D., F.A.C.C.

## 2019-03-17 IMAGING — NM NM MYOCAR MULTI W/SPECT W/WALL MOTION & EF
2 series · 12 of 12 positions shown · non-contrast
Comparison: none

[Series 1: rest · 6.51mm/px · 6 of 64 frames shown]
[frame 6/64]
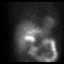
[frame 16/64]
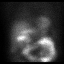
[frame 27/64]
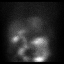
[frame 38/64]
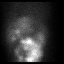
[frame 48/64]
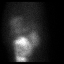
[frame 59/64]
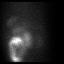

[Series 3: stress gated - perfusion · 6.51mm/px · 6 of 64 frames shown]
[frame 6/64]
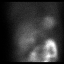
[frame 16/64]
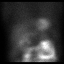
[frame 27/64]
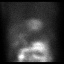
[frame 38/64]
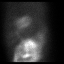
[frame 48/64]
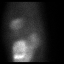
[frame 59/64]
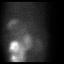

[12 of 12 positions shown; findings below may reference images not displayed]

Canned report from images found in remote index.

Refer to host system for actual result text.

## 2019-06-18 ENCOUNTER — Telehealth: Payer: Self-pay | Admitting: Cardiovascular Disease

## 2019-06-18 NOTE — Telephone Encounter (Signed)

## 2019-06-20 ENCOUNTER — Telehealth (INDEPENDENT_AMBULATORY_CARE_PROVIDER_SITE_OTHER): Payer: No Typology Code available for payment source | Admitting: Cardiovascular Disease

## 2019-06-20 ENCOUNTER — Encounter: Payer: Self-pay | Admitting: Cardiovascular Disease

## 2019-06-20 VITALS — Ht 70.5 in | Wt 205.0 lb

## 2019-06-20 DIAGNOSIS — R55 Syncope and collapse: Secondary | ICD-10-CM

## 2019-06-20 DIAGNOSIS — R9439 Abnormal result of other cardiovascular function study: Secondary | ICD-10-CM

## 2019-06-20 DIAGNOSIS — R5383 Other fatigue: Secondary | ICD-10-CM

## 2019-06-20 DIAGNOSIS — E785 Hyperlipidemia, unspecified: Secondary | ICD-10-CM

## 2019-06-20 DIAGNOSIS — E119 Type 2 diabetes mellitus without complications: Secondary | ICD-10-CM

## 2019-06-20 DIAGNOSIS — I10 Essential (primary) hypertension: Secondary | ICD-10-CM

## 2019-06-20 DIAGNOSIS — R002 Palpitations: Secondary | ICD-10-CM | POA: Diagnosis not present

## 2019-06-20 MED ORDER — ROSUVASTATIN CALCIUM 5 MG PO TABS: 5.0000 mg | ORAL_TABLET | Freq: Every day | ORAL | 1 refills | Status: DC

## 2019-06-20 NOTE — Addendum Note (Signed)
Addended by: Julian Hy T on: 06/20/2019 03:58 PM   Modules accepted: Orders

## 2019-06-20 NOTE — Patient Instructions (Signed)
Your physician wants you to follow-up in: [place] will receive a reminder letter in the mail two months in advance. If you don't receive a letter, please call our office to schedule the follow-up appointment.  Your physician has recommended you make the following change in your medication:   START CRESTOR 5 MG DAILY  Thank you for choosing [facility]!!

## 2019-06-20 NOTE — Progress Notes (Signed)
Virtual Visit via Telephone Note   This visit type was conducted due to national recommendations for restrictions regarding the COVID-19 Pandemic (e.g. social distancing) in an effort to limit this patient's exposure and mitigate transmission in our community.  Due to his co-morbid illnesses, this patient is at least at moderate risk for complications without adequate follow up.  This format is felt to be most appropriate for this patient at this time.  The patient did not have access to video technology/had technical difficulties with video requiring transitioning to audio format only (telephone).  All issues noted in this document were discussed and addressed.  No physical exam could be performed with this format.  Please refer to the patient's chart for his  consent to telehealth for [facility].   Date:  06/20/2019   ID:  Peter Underwood., DOB [birth year], MRN [id number]  Patient Location: Home Provider Location: Home  PCP:  Peter Percy, MD  Cardiologist:  Peter Sable, MD  Electrophysiologist:  None   Evaluation Performed:  Follow-Up Visit  Chief Complaint: Near syncope and fatigue  History of Present Illness:    Peter Deadmon. is a 59 y.o. male with a history of near syncope, fatigue, palpitations, and hypertension.  Nuclear stress test 02/22/2018 demonstrated a hypertensive response to exercise. There was a defect in the mid inferior and apical inferior locations consistent with a small prior myocardial infarction with moderate peri-infarct ischemia, LVEF 48%. It was deemed an intermediate risk study.   He did not tolerate atorvastatin.  He said he "had an episode" about 2 months ago. When I asked him about this he described palpitations. Since stopping tamsulosin he has not had any recurrences.  He denies chest pain and shortness of breath.     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  Past Medical History:   Diagnosis Date  . Hypertension    Past Surgical History:  Procedure Laterality Date  . APPENDECTOMY    . COLONOSCOPY N/A 03/10/2015   Procedure: COLONOSCOPY;  Surgeon: Peter Houston, MD;  [facility]: [facility];  Service: Endoscopy;  Laterality: N/A;  930     Current Meds  Medication Sig  . aspirin EC 81 MG tablet Take 81 mg by mouth daily.  Marland Kitchen dutasteride (AVODART) 0.5 MG capsule Take 0.5 mg by mouth daily.  Marland Kitchen lisinopril (PRINIVIL,ZESTRIL) 10 MG tablet Take 5 mg by mouth daily.   . sildenafil (REVATIO) 20 MG tablet Take 20 mg by mouth.  . [DISCONTINUED] tamsulosin (FLOMAX) 0.4 MG CAPS capsule Take 0.4 mg by mouth daily.     Allergies:   Patient has no known allergies.   Social History   Tobacco Use  . Smoking status: Former Research scientist (life sciences)  . Smokeless tobacco: Current User    Types: Chew  Substance Use Topics  . Alcohol use: Yes    Comment: occasional beer  . Drug use: No     Family Hx: The patient's family history includes Alzheimer's disease in his mother; Arthritis in his father; Brain cancer in his brother; Diabetes in his father; Hypertension in his father and mother.  ROS:   Please see the history of present illness.     All other systems reviewed and are negative.   Prior CV studies:   The following studies were reviewed today:  Reviewed above  Labs/Other Tests and Data Reviewed:    EKG:  No ECG reviewed.  Recent Labs: No results  found for requested labs within last 8760 hours.   Recent Lipid Panel No results found for: CHOL, TRIG, HDL, CHOLHDL, LDLCALC, LDLDIRECT  Wt Readings from Last 3 Encounters:  06/20/19 205 lb (93 kg)  07/17/18 220 lb 6.4 oz (100 kg)  03/26/18 221 lb (100.2 kg)     Objective:    Vital Signs:  Ht 5' 10.5" (1.791 m)   Wt 205 lb (93 kg)   BMI 29.00 kg/m    VITAL SIGNS:  reviewed  ASSESSMENT & PLAN:    1. Near syncope and fatigue:No symptom recurrence.Nuclear stress test reviewed above suggestive of ischemic heart  disease. Currently on aspirin 81 mg.  He did not tolerate atorvastatin so I will try low dose Crestor 5 mg daily. I will continue to monitor symptoms. If he develops worsening fatigue or has chest pain/shortness of breath, I would then arrange for coronary angiography.  2. Palpitations: No significant arrhythmias with event monitoring. No symptom recurrence.  3. Hypertension:No changes to therapy.  4.  Type 2 diabetes mellitus: He has changed his diet and has lost a considerable amount of weight.  5. Hyperlipidemia: He did not tolerate atorvastatin. I will try Crestor 5 mg daily.   COVID-19 Education: The signs and symptoms of COVID-19 were discussed with the patient and how to seek care for testing (follow up with PCP or arrange E-visit).  The importance of social distancing was discussed today.  Time:   Today, I have spent 10 minutes with the patient with telehealth technology discussing the above problems.     Medication Adjustments/Labs and Tests Ordered: Current medicines are reviewed at length with the patient today.  Concerns regarding medicines are outlined above.   Tests Ordered: No orders of the defined types were placed in this encounter.   Medication Changes: No orders of the defined types were placed in this encounter.   Follow Up:  In Person in 1 year(s)  Signed, Peter Sable, MD  06/20/2019 3:30 PM    [place]

## 2020-08-04 NOTE — Progress Notes (Signed)
Patient's chart and last cards OV which was 06-20-19 reviewed with Dr Kalman Shan. He states that patient will need a cards clearance and OV before scheduled surgery. Claiborne Billings at Dr [facility] office notified.

## 2020-08-04 NOTE — H&P (Signed)
PREOPERATIVE H&P  Chief Complaint: OSTEOARTHRITIS RIGHT SHOULDER,IMPINGEMENT SYNDROME,STRAIN OF MUSCLE AND TENDON OF THE ROTATOR CUFF  HPI: Peter Underwood. is a 60 y.o. male who presents with a diagnosis of OSTEOARTHRITIS RIGHT [place] AND TENDON OF THE ROTATOR CUFF. He was doing some work at home on 07/04/20 and slipped on the ice causing him to fall down with his right arm outstretched. He felt immediate pain and a pop in his shoulder. He tried to treat it on his own but is having trouble with ROM especially reaching over head. Symptoms are rated as moderate to severe, and have been worsening.His PCP sent him for an MRI that showed full thickness supraspinatus tear. This is significantly impairing activities of daily living. He has elected for surgical management.   Past Medical History:  Diagnosis Date  . Hypertension    Past Surgical History:  Procedure Laterality Date  . APPENDECTOMY    . COLONOSCOPY N/A 03/10/2015   Procedure: COLONOSCOPY;  Surgeon: Rogene Houston, MD;  Location: [facility];  Service: Endoscopy;  Laterality: N/A;  930   Social History   Socioeconomic History  . Marital status: Married    Spouse name: Not on file  . Number of children: Not on file  . Years of education: Not on file  . Highest education level: Not on file  Occupational History  . Not on file  Tobacco Use  . Smoking status: Former Research scientist (life sciences)  . Smokeless tobacco: Current User    Types: Chew  Vaping Use  . Vaping Use: Never used  Substance and Sexual Activity  . Alcohol use: Yes    Comment: occasional beer  . Drug use: No  . Sexual activity: Not on file  Other Topics Concern  . Not on file  Social History Narrative  . Not on file   Social Determinants of Health   Financial Resource Strain: Not on file  Food Insecurity: Not on file  Transportation Needs: Not on file  Physical Activity: Not on file  Stress: Not on file  Social Connections: Not  on file   Family History  Problem Relation Age of Onset  . Hypertension Mother   . Alzheimer's disease Mother   . Diabetes Father   . Hypertension Father   . Arthritis Father   . Brain cancer Brother    No Known Allergies Prior to Admission medications   Medication Sig Start Date End Date Taking? Authorizing Provider  aspirin EC 81 MG tablet Take 81 mg by mouth daily.    [provider]  dutasteride (AVODART) 0.5 MG capsule Take 0.5 mg by mouth daily.    [provider]  lisinopril (PRINIVIL,ZESTRIL) 10 MG tablet Take 5 mg by mouth daily.  11/24/14   [provider]  rosuvastatin (CRESTOR) 5 MG tablet Take 1 tablet (5 mg total) by mouth daily. 06/20/19 09/18/19  Herminio Commons, MD  sildenafil (REVATIO) 20 MG tablet Take 20 mg by mouth.    [provider]     Positive ROS: All other systems have been reviewed and were otherwise negative with the exception of those mentioned in the HPI and as above.  Physical Exam: General: Alert, no acute distress Cardiovascular: No pedal edema Respiratory: No cyanosis, no use of accessory musculature GI: No organomegaly, abdomen is soft and non-tender Skin: No lesions in the area of chief complaint Neurologic: Sensation intact distally Psychiatric: Patient is competent for consent with normal mood and affect Lymphatic: No axillary  or cervical lymphadenopathy  MUSCULOSKELETAL: anterior right shoulder edema, TTP over AC joint, anterior, and lateral shoulder, limited ROM particulary abduction and internal rotation, + empty can, + drop arm, + Neer's, + Hawkins  Imaging: MRI and U/S show full thickness supraspinatus tear with retraction, fluid around the biceps tendon, some chronic degeneration of the subscapularis  Assessment: OSTEOARTHRITIS RIGHT SHOULDER,IMPINGEMENT SYNDROME,STRAIN OF MUSCLE AND TENDON OF THE ROTATOR CUFF  Plan: Plan for Procedure(s): SHOULDER ARTHROSCOPY WITH DISTAL CLAVICLE  RESECTION SHOULDER ACROMIOPLASTY SHOULDER ARTHROSCOPY WITH ROTATOR CUFF REPAIR BICEPS TENODESIS IRRIGATION AND DEBRIDEMENT SHOULDER  The risks benefits and alternatives were discussed with the patient including but not limited to the risks of nonoperative treatment, versus surgical intervention including infection, bleeding, nerve injury,  blood clots, cardiopulmonary complications, morbidity, mortality, among others, and they were willing to proceed.   Weightbearing: NWB RUE Orthopedic devices: sling Showering: on postop day 3 Dressing: Leave on until follow up. Reinforce as needed Medicines: Norco, Celebrex, Zofran, Robaxin, Omeprazole   Discharge: home Follow up: 2 weeks   Britt Bottom, [place] Office [phone]

## 2020-08-06 ENCOUNTER — Other Ambulatory Visit (HOSPITAL_COMMUNITY)
Admission: RE | Admit: 2020-08-06 | Discharge: 2020-08-06 | Disposition: A | Discharge status: Home / Self Care | Payer: No Typology Code available for payment source | Source: Ambulatory Visit | Attending: Orthopedic Surgery | Admitting: Orthopedic Surgery

## 2020-08-06 DIAGNOSIS — Z20822 Contact with and (suspected) exposure to covid-19: Secondary | ICD-10-CM | POA: Insufficient documentation

## 2020-08-06 DIAGNOSIS — Z01812 Encounter for preprocedural laboratory examination: Secondary | ICD-10-CM | POA: Insufficient documentation

## 2020-08-06 LAB — SARS CORONAVIRUS 2 (TAT 6-24 HRS): SARS Coronavirus 2: NEGATIVE

## 2020-08-06 NOTE — Progress Notes (Signed)
Spoke with Sherri at Dr [address]bro[facility] [address] office about cardiac clearance for this patient. She states that she sent in clearance form but has not heard back for Dr Kristian Covey office about an appointment. She states she wants to wait until Monday to see if cards was able to get him in for an appointment. If not she will cancel him.

## 2020-08-09 ENCOUNTER — Encounter (HOSPITAL_BASED_OUTPATIENT_CLINIC_OR_DEPARTMENT_OTHER)
Admission: RE | Admit: 2020-08-09 | Discharge: 2020-08-09 | Disposition: A | Discharge status: Home / Self Care | Payer: No Typology Code available for payment source | Source: Ambulatory Visit | Attending: Orthopedic Surgery | Admitting: Orthopedic Surgery

## 2020-08-09 ENCOUNTER — Other Ambulatory Visit: Payer: Self-pay

## 2020-08-09 ENCOUNTER — Encounter: Payer: Self-pay | Admitting: Family Medicine

## 2020-08-09 ENCOUNTER — Ambulatory Visit (INDEPENDENT_AMBULATORY_CARE_PROVIDER_SITE_OTHER): Payer: No Typology Code available for payment source | Admitting: Family Medicine

## 2020-08-09 ENCOUNTER — Encounter (HOSPITAL_BASED_OUTPATIENT_CLINIC_OR_DEPARTMENT_OTHER): Payer: Self-pay | Admitting: Orthopedic Surgery

## 2020-08-09 VITALS — BP 116/72 | HR 87 | Ht 70.0 in | Wt 213.2 lb

## 2020-08-09 DIAGNOSIS — R55 Syncope and collapse: Secondary | ICD-10-CM

## 2020-08-09 DIAGNOSIS — E782 Mixed hyperlipidemia: Secondary | ICD-10-CM | POA: Diagnosis not present

## 2020-08-09 DIAGNOSIS — Z01810 Encounter for preprocedural cardiovascular examination: Secondary | ICD-10-CM | POA: Insufficient documentation

## 2020-08-09 DIAGNOSIS — Z79899 Other long term (current) drug therapy: Secondary | ICD-10-CM | POA: Diagnosis not present

## 2020-08-09 DIAGNOSIS — M75121 Complete rotator cuff tear or rupture of right shoulder, not specified as traumatic: Secondary | ICD-10-CM | POA: Diagnosis not present

## 2020-08-09 DIAGNOSIS — Z8261 Family history of arthritis: Secondary | ICD-10-CM | POA: Diagnosis not present

## 2020-08-09 DIAGNOSIS — R002 Palpitations: Secondary | ICD-10-CM | POA: Diagnosis not present

## 2020-08-09 DIAGNOSIS — W000XXA Fall on same level due to ice and snow, initial encounter: Secondary | ICD-10-CM | POA: Diagnosis not present

## 2020-08-09 DIAGNOSIS — Z7982 Long term (current) use of aspirin: Secondary | ICD-10-CM | POA: Diagnosis not present

## 2020-08-09 DIAGNOSIS — M19011 Primary osteoarthritis, right shoulder: Secondary | ICD-10-CM | POA: Diagnosis present

## 2020-08-09 DIAGNOSIS — I10 Essential (primary) hypertension: Secondary | ICD-10-CM | POA: Diagnosis not present

## 2020-08-09 DIAGNOSIS — Z01818 Encounter for other preprocedural examination: Secondary | ICD-10-CM | POA: Diagnosis not present

## 2020-08-09 DIAGNOSIS — M7541 Impingement syndrome of right shoulder: Secondary | ICD-10-CM | POA: Diagnosis not present

## 2020-08-09 DIAGNOSIS — Z8249 Family history of ischemic heart disease and other diseases of the circulatory system: Secondary | ICD-10-CM | POA: Diagnosis not present

## 2020-08-09 DIAGNOSIS — Z87891 Personal history of nicotine dependence: Secondary | ICD-10-CM | POA: Diagnosis not present

## 2020-08-09 LAB — BASIC METABOLIC PANEL
Anion gap: 12 (ref 5–15)
BUN: 20 mg/dL (ref 8–23)
CO2: 23 mmol/L (ref 22–32)
Calcium: 9.4 mg/dL (ref 8.9–10.3)
Chloride: 100 mmol/L (ref 98–111)
Creatinine, Ser: 1.01 mg/dL (ref 0.61–1.24)
GFR, Estimated: >60 mL/min (ref >60)
Glucose, Bld: 94 mg/dL (ref 70–99)
Potassium: 5.2 mmol/L — ABNORMAL HIGH (ref 3.5–5.1)
Sodium: 135 mmol/L (ref 135–145)

## 2020-08-09 NOTE — Progress Notes (Signed)

## 2020-08-09 NOTE — Progress Notes (Signed)
Cardiology Office Note  Date: 08/09/2020   ID: Peter Underwood., DOB 21-May-1961, MRN [id number]  PCP:  Lavella Lemons, PA  Cardiologist:  No primary care provider on file. Electrophysiologist:  None   Chief Complaint: Preop clearance shoulder surgery  History of Present Illness: Peter Underwood. is a 60 y.o. male with a history of near syncope, palpitations, fatigue, abnormal stress test, type 2 diabetes, hyperlipidemia, hypertension.  Last visit with Dr. Bronson Ing via telemedicine 06/20/2019.  Previous stress test 2019 demonstrated hypertensive response to exercise.  Defect in mid inferior and apical inferior locations consistent with small prior MI with moderate peri-infarct ischemia, LVEF 48%.  Deemed an intermediate risk study.  He did not tolerate atorvastatin.  Had an episode about 2 months prior with palpitations.  He stated since stopping tamsulosin he had not had any recurrences.  He denied any chest pain or shortness of breath.  He has not tolerated atorvastatin.  He was started on Crestor 5 mg daily.  No changes to antihypertensive therapy.  No significant arrhythmias with event monitoring.  No symptom recurrence  He is here today for clearance for right shoulder surgery.  He denies any recent syncopal or near syncopal episodes ED lightheadedness, dizziness, presyncopal or syncopal episodes.  He states this cleared up after he stopped his tamsulosin.  Denies any palpitations palpitations or arrhythmias, CVA or TIA-like symptoms, PND, orthopnea, bleeding, claudication-like symptoms, DVT or PE-like symptoms, lower extremity edema.  States he recently had bilateral cataract surgeries.  He is scheduled for shoulder arthroscopy with distal clavicle resection, right shoulder acromioplasty, rotator cuff repair, biceps tenodesis and irrigation debridement of shoulder.  Dr. Edmonia Lynch.  Past Medical History:  Diagnosis Date  . Hypertension     Past Surgical History:   Procedure Laterality Date  . APPENDECTOMY    . COLONOSCOPY N/A 03/10/2015   Procedure: COLONOSCOPY;  Surgeon: Rogene Houston, MD;  Location: [facility];  Service: Endoscopy;  Laterality: N/A;  930  . EYE SURGERY      Current Outpatient Medications  Medication Sig Dispense Refill  . lisinopril (PRINIVIL,ZESTRIL) 10 MG tablet Take 10 mg by mouth daily.  1  . sildenafil (REVATIO) 20 MG tablet Take 20 mg by mouth as needed.     No current facility-administered medications for this visit.   Allergies:  Patient has no known allergies.   Social History: The patient  reports that he has quit smoking. His smokeless tobacco use includes chew. He reports current alcohol use. He reports that he does not use drugs.   Family History: The patient's family history includes Alzheimer's disease in his mother; Arthritis in his father; Brain cancer in his brother; Diabetes in his father; Hypertension in his father and mother.   ROS:  Please see the history of present illness. Otherwise, complete review of systems is positive for none.  All other systems are reviewed and negative.   Physical Exam: VS:  BP 116/72   Pulse 87   Ht 5\' 10"  (1.778 m)   Wt 213 lb 3.2 oz (96.7 kg)   SpO2 96%   BMI 30.59 kg/m , BMI Body mass index is 30.59 kg/m.  Wt Readings from Last 3 Encounters:  08/09/20 213 lb 3.2 oz (96.7 kg)  06/20/19 205 lb (93 kg)  07/17/18 220 lb 6.4 oz (100 kg)    General: Patient appears comfortable at rest. Neck: Supple, no elevated JVP or carotid bruits, no thyromegaly. Lungs: Clear to auscultation,  nonlabored breathing at rest. Cardiac: Regular rate and rhythm, no S3 or significant systolic murmur, no pericardial rub. Extremities: No pitting edema, distal pulses 2+. Skin: Warm and dry. Musculoskeletal: No kyphosis. Neuropsychiatric: Alert and oriented x3, affect grossly appropriate.  ECG:  August 09, 2020 EKG normal sinus rhythm.  Recent Labwork: No results found for  requested labs within last 8760 hours.  No results found for: CHOL, TRIG, HDL, CHOLHDL, VLDL, LDLCALC, LDLDIRECT  Other Studies Reviewed Today:  Nuclear stress test 02/22/2018 Narrative & Impression   Blood pressure demonstrated a hypertensive response to exercise.  There was no ST segment deviation noted during stress.  Defect 1: There is a medium defect of moderate severity present in the mid inferior and apical inferior location.  Findings consistent with small prior myocardial infarction with moderate peri-infarct ischemia.  This is an intermediate risk study.  Nuclear stress EF: 48%.        Cardiac monitor 04/24/2018 Study Highlights   Sinus rhythm, sinus bradycardia, and sinus tachycardia seen. No significant arrhythmias.    Assessment and Plan:  1. Preoperative clearance   2. Near syncope   3. Palpitations   4. Mixed hyperlipidemia   5. Essential hypertension    1. Preoperative clearance Patient is scheduled for surgery tomorrow with Dr. Edmonia Lynch. He is scheduled for shoulder arthroscopy with distal clavicle resection, right shoulder acromioplasty, rotator cuff repair, biceps tenodesis and irrigation debridement of shoulder.  His [facility] activity status index for is 50.7 placing given the patient a functional capacity and METS at 8.9.  His revised cardiac risk index = 1 placing him at a perioperative risk of major cardiac event at 0.9%.  From a cardiac standpoint he is cleared to undergo right shoulder surgery under general anesthesia.  2. Near syncope No more near syncopal episodes since stopping tamsulosin in the past.  3. Palpitations Denies any palpitations or arrhythmias.  4. Mixed hyperlipidemia Intolerant to statins.    5. Essential hypertension Blood pressure well controlled with current therapy.  Continue lisinopril 10 mg p.o. daily.  Medication Adjustments/Labs and Tests Ordered: Current medicines are reviewed at length with the patient  today.  Concerns regarding medicines are outlined above.   Disposition: Follow-up with Dr. Harl Bowie or [facility] 1 year  Signed, [name], NP 08/09/2020 2:04 PM    [facility] [address], [place], [place] [address] Phone: [phone]; Fax: [phone]

## 2020-08-09 NOTE — Progress Notes (Signed)
Left message for patient to come in for lab work if he is still having surgery tomorrow.

## 2020-08-09 NOTE — Patient Instructions (Signed)

## 2020-08-10 ENCOUNTER — Other Ambulatory Visit: Payer: Self-pay

## 2020-08-10 ENCOUNTER — Encounter (HOSPITAL_BASED_OUTPATIENT_CLINIC_OR_DEPARTMENT_OTHER): Payer: Self-pay | Admitting: Orthopedic Surgery

## 2020-08-10 ENCOUNTER — Ambulatory Visit (HOSPITAL_BASED_OUTPATIENT_CLINIC_OR_DEPARTMENT_OTHER)
Admission: RE | Admit: 2020-08-10 | Discharge: 2020-08-10 | Disposition: A | Discharge status: Home / Self Care | Payer: No Typology Code available for payment source | Attending: Orthopedic Surgery | Admitting: Orthopedic Surgery

## 2020-08-10 ENCOUNTER — Ambulatory Visit (HOSPITAL_BASED_OUTPATIENT_CLINIC_OR_DEPARTMENT_OTHER): Payer: No Typology Code available for payment source | Admitting: Anesthesiology

## 2020-08-10 ENCOUNTER — Encounter (HOSPITAL_BASED_OUTPATIENT_CLINIC_OR_DEPARTMENT_OTHER)
Admission: RE | Disposition: A | Discharge status: Home / Self Care | Payer: Self-pay | Source: Home / Self Care | Attending: Orthopedic Surgery

## 2020-08-10 DIAGNOSIS — M7541 Impingement syndrome of right shoulder: Secondary | ICD-10-CM | POA: Insufficient documentation

## 2020-08-10 DIAGNOSIS — I10 Essential (primary) hypertension: Secondary | ICD-10-CM | POA: Insufficient documentation

## 2020-08-10 DIAGNOSIS — W000XXA Fall on same level due to ice and snow, initial encounter: Secondary | ICD-10-CM | POA: Insufficient documentation

## 2020-08-10 DIAGNOSIS — Z79899 Other long term (current) drug therapy: Secondary | ICD-10-CM | POA: Insufficient documentation

## 2020-08-10 DIAGNOSIS — Z87891 Personal history of nicotine dependence: Secondary | ICD-10-CM | POA: Insufficient documentation

## 2020-08-10 DIAGNOSIS — M19011 Primary osteoarthritis, right shoulder: Secondary | ICD-10-CM | POA: Diagnosis not present

## 2020-08-10 DIAGNOSIS — Z8249 Family history of ischemic heart disease and other diseases of the circulatory system: Secondary | ICD-10-CM | POA: Insufficient documentation

## 2020-08-10 DIAGNOSIS — Z7982 Long term (current) use of aspirin: Secondary | ICD-10-CM | POA: Insufficient documentation

## 2020-08-10 DIAGNOSIS — M75121 Complete rotator cuff tear or rupture of right shoulder, not specified as traumatic: Secondary | ICD-10-CM | POA: Insufficient documentation

## 2020-08-10 DIAGNOSIS — Z8261 Family history of arthritis: Secondary | ICD-10-CM | POA: Insufficient documentation

## 2020-08-10 HISTORY — PX: SHOULDER ACROMIOPLASTY: SHX6093

## 2020-08-10 HISTORY — PX: BICEPT TENODESIS: SHX5116

## 2020-08-10 HISTORY — PX: OPEN SUBSCAPULARIS REPAIR: SHX6233

## 2020-08-10 HISTORY — PX: IRRIGATION AND DEBRIDEMENT SHOULDER: SHX5880

## 2020-08-10 HISTORY — PX: SHOULDER ARTHROSCOPY WITH ROTATOR CUFF REPAIR: SHX5685

## 2020-08-10 HISTORY — PX: SHOULDER ARTHROSCOPY WITH DISTAL CLAVICLE RESECTION: SHX5675

## 2020-08-10 SURGERY — SHOULDER ARTHROSCOPY WITH DISTAL CLAVICLE RESECTION
Anesthesia: Regional | Site: Shoulder | Laterality: Right

## 2020-08-10 MED ORDER — ONDANSETRON HCL 4 MG/2ML IJ SOLN
INTRAMUSCULAR | Status: DC | PRN
  Administered 2020-08-10: 4 mg via INTRAVENOUS

## 2020-08-10 MED ORDER — ACETAMINOPHEN 500 MG PO TABS
ORAL_TABLET | ORAL | Status: AC
  Filled 2020-08-10: qty 2

## 2020-08-10 MED ORDER — OXYCODONE HCL 5 MG/5ML PO SOLN: 5.0000 mg | Freq: Once | ORAL | Status: DC | PRN

## 2020-08-10 MED ORDER — DEXAMETHASONE SODIUM PHOSPHATE 10 MG/ML IJ SOLN: 8.0000 mg | Freq: Once | INTRAMUSCULAR | Status: DC

## 2020-08-10 MED ORDER — BUPIVACAINE LIPOSOME 1.3 % IJ SUSP
INTRAMUSCULAR | Status: DC | PRN
  Administered 2020-08-10: 10 mL via PERINEURAL

## 2020-08-10 MED ORDER — ONDANSETRON HCL 4 MG PO TABS: 4.0000 mg | ORAL_TABLET | Freq: Every day | ORAL | 0 refills | Status: DC | PRN

## 2020-08-10 MED ORDER — BUPIVACAINE HCL (PF) 0.5 % IJ SOLN
INTRAMUSCULAR | Status: DC | PRN
  Administered 2020-08-10: 15 mL via PERINEURAL

## 2020-08-10 MED ORDER — METHOCARBAMOL 500 MG PO TABS: 500.0000 mg | ORAL_TABLET | Freq: Three times a day (TID) | ORAL | 0 refills | Status: DC | PRN

## 2020-08-10 MED ORDER — MIDAZOLAM HCL 2 MG/2ML IJ SOLN
2.0000 mg | Freq: Once | INTRAMUSCULAR | Status: AC
  Administered 2020-08-10: 2 mg via INTRAVENOUS

## 2020-08-10 MED ORDER — LACTATED RINGERS IV SOLN: INTRAVENOUS | Status: DC

## 2020-08-10 MED ORDER — CELECOXIB 200 MG PO CAPS
ORAL_CAPSULE | ORAL | Status: AC
  Filled 2020-08-10: qty 2

## 2020-08-10 MED ORDER — PHENYLEPHRINE HCL (PRESSORS) 10 MG/ML IV SOLN
INTRAVENOUS | Status: DC | PRN
  Administered 2020-08-10 (×4): 80 ug via INTRAVENOUS

## 2020-08-10 MED ORDER — EPHEDRINE SULFATE 50 MG/ML IJ SOLN
INTRAMUSCULAR | Status: DC | PRN
  Administered 2020-08-10 (×3): 5 mg via INTRAVENOUS

## 2020-08-10 MED ORDER — CEFAZOLIN SODIUM-DEXTROSE 2-4 GM/100ML-% IV SOLN
INTRAVENOUS | Status: AC
  Filled 2020-08-10: qty 100

## 2020-08-10 MED ORDER — CELECOXIB 200 MG PO CAPS
400.0000 mg | ORAL_CAPSULE | Freq: Once | ORAL | Status: AC
  Administered 2020-08-10: 400 mg via ORAL

## 2020-08-10 MED ORDER — OXYCODONE HCL 5 MG PO TABS: 5.0000 mg | ORAL_TABLET | Freq: Once | ORAL | Status: DC | PRN

## 2020-08-10 MED ORDER — PROPOFOL 10 MG/ML IV BOLUS
INTRAVENOUS | Status: DC | PRN
  Administered 2020-08-10: 200 mg via INTRAVENOUS

## 2020-08-10 MED ORDER — ROCURONIUM BROMIDE 100 MG/10ML IV SOLN
INTRAVENOUS | Status: DC | PRN
  Administered 2020-08-10: 40 mg via INTRAVENOUS

## 2020-08-10 MED ORDER — FENTANYL CITRATE (PF) 100 MCG/2ML IJ SOLN: 25.0000 ug | INTRAMUSCULAR | Status: DC | PRN

## 2020-08-10 MED ORDER — DEXAMETHASONE SODIUM PHOSPHATE 4 MG/ML IJ SOLN
INTRAMUSCULAR | Status: DC | PRN
  Administered 2020-08-10: 10 mg via INTRAVENOUS

## 2020-08-10 MED ORDER — PROMETHAZINE HCL 25 MG/ML IJ SOLN: 6.2500 mg | INTRAMUSCULAR | Status: DC | PRN

## 2020-08-10 MED ORDER — OMEPRAZOLE 20 MG PO CPDR: 20.0000 mg | DELAYED_RELEASE_CAPSULE | Freq: Every day | ORAL | 0 refills | Status: DC

## 2020-08-10 MED ORDER — CELECOXIB 100 MG PO CAPS: 100.0000 mg | ORAL_CAPSULE | Freq: Two times a day (BID) | ORAL | 0 refills | Status: AC

## 2020-08-10 MED ORDER — SUGAMMADEX SODIUM 200 MG/2ML IV SOLN
INTRAVENOUS | Status: DC | PRN
  Administered 2020-08-10: 200 mg via INTRAVENOUS

## 2020-08-10 MED ORDER — CEFAZOLIN SODIUM-DEXTROSE 2-4 GM/100ML-% IV SOLN
2.0000 g | INTRAVENOUS | Status: AC
  Administered 2020-08-10: 2 g via INTRAVENOUS

## 2020-08-10 MED ORDER — ACETAMINOPHEN 500 MG PO TABS
1000.0000 mg | ORAL_TABLET | Freq: Once | ORAL | Status: AC
  Administered 2020-08-10: 1000 mg via ORAL

## 2020-08-10 MED ORDER — FENTANYL CITRATE (PF) 100 MCG/2ML IJ SOLN
INTRAMUSCULAR | Status: AC
  Filled 2020-08-10: qty 2

## 2020-08-10 MED ORDER — LIDOCAINE HCL (CARDIAC) PF 100 MG/5ML IV SOSY
PREFILLED_SYRINGE | INTRAVENOUS | Status: DC | PRN
  Administered 2020-08-10: 40 mg via INTRAVENOUS

## 2020-08-10 MED ORDER — SODIUM CHLORIDE 0.9 % IR SOLN
Status: DC | PRN
  Administered 2020-08-10: 12000 mL

## 2020-08-10 MED ORDER — MIDAZOLAM HCL 2 MG/2ML IJ SOLN
INTRAMUSCULAR | Status: AC
  Filled 2020-08-10: qty 2

## 2020-08-10 MED ORDER — HYDROCODONE-ACETAMINOPHEN 10-325 MG PO TABS: 1.0000 | ORAL_TABLET | Freq: Four times a day (QID) | ORAL | 0 refills | Status: DC | PRN

## 2020-08-10 SURGICAL SUPPLY — 76 items
AID PSTN UNV HD RSTRNT DISP (MISCELLANEOUS) ×2
ANCH SUT 2.9 PUSHLOCK ANCH (Orthopedic Implant) ×2 IMPLANT
APL PRP STRL LF DISP 70% ISPRP (MISCELLANEOUS) ×2
BLADE CLIPPER SURG (BLADE) IMPLANT
BLADE EXCALIBUR 4.0X13 (MISCELLANEOUS) ×3 IMPLANT
BLADE SURG 15 STRL LF DISP TIS (BLADE) ×2 IMPLANT
BLADE SURG 15 STRL SS (BLADE) ×3
BURR OVAL 8 FLU 5.0X13 (MISCELLANEOUS) ×3 IMPLANT
CANNULA 5.75X71 LONG (CANNULA) ×3 IMPLANT
CANNULA TWIST IN 8.25X7CM (CANNULA) ×3 IMPLANT
CHLORAPREP W/TINT 26 (MISCELLANEOUS) ×3 IMPLANT
CLSR STERI-STRIP ANTIMIC 1/2X4 (GAUZE/BANDAGES/DRESSINGS) IMPLANT
COVER WAND RF STERILE (DRAPES) IMPLANT
DECANTER SPIKE VIAL GLASS SM (MISCELLANEOUS) IMPLANT
DISSECTOR  3.8MM X 13CM (MISCELLANEOUS) ×1
DISSECTOR 3.8MM X 13CM (MISCELLANEOUS) ×2 IMPLANT
DRAPE IMP U-DRAPE 54X76 (DRAPES) ×3 IMPLANT
DRAPE INCISE IOBAN 66X45 STRL (DRAPES) ×3 IMPLANT
DRAPE SHOULDER BEACH CHAIR (DRAPES) ×3 IMPLANT
DRAPE U-SHAPE 47X51 STRL (DRAPES) ×3 IMPLANT
DRAPE U-SHAPE 76X120 STRL (DRAPES) ×6 IMPLANT
DRSG EMULSION OIL 3X3 NADH (GAUZE/BANDAGES/DRESSINGS) ×3 IMPLANT
DRSG PAD ABDOMINAL 8X10 ST (GAUZE/BANDAGES/DRESSINGS) ×3 IMPLANT
DRSG TEGADERM 4X4.75 (GAUZE/BANDAGES/DRESSINGS) IMPLANT
ELECT REM PT RETURN 9FT ADLT (ELECTROSURGICAL) ×3
ELECTRODE REM PT RTRN 9FT ADLT (ELECTROSURGICAL) ×2 IMPLANT
GAUZE SPONGE 4X4 12PLY STRL (GAUZE/BANDAGES/DRESSINGS) ×3 IMPLANT
GAUZE XEROFORM 1X8 LF (GAUZE/BANDAGES/DRESSINGS) IMPLANT
GLOVE SRG 8 PF TXTR STRL LF DI (GLOVE) ×4 IMPLANT
GLOVE SURG ENC MOIS LTX SZ7.5 (GLOVE) ×6 IMPLANT
GLOVE SURG UNDER POLY LF SZ8 (GLOVE) ×6
GOWN STRL REUS W/ TWL LRG LVL3 (GOWN DISPOSABLE) ×10 IMPLANT
GOWN STRL REUS W/ TWL XL LVL3 (GOWN DISPOSABLE) ×2 IMPLANT
GOWN STRL REUS W/TWL LRG LVL3 (GOWN DISPOSABLE) ×15
GOWN STRL REUS W/TWL XL LVL3 (GOWN DISPOSABLE) ×6 IMPLANT
IMPL SPEEDBRIDGE KIT (Orthopedic Implant) ×2 IMPLANT
IMPLANT SPEEDBRIDGE KIT (Orthopedic Implant) ×3 IMPLANT
MANIFOLD NEPTUNE II (INSTRUMENTS) ×3 IMPLANT
NEEDLE SCORPION MULTI FIRE (NEEDLE) ×3 IMPLANT
NS IRRIG 1000ML POUR BTL (IV SOLUTION) ×3 IMPLANT
PACK ARTHROSCOPY DSU (CUSTOM PROCEDURE TRAY) ×3 IMPLANT
PACK BASIN DAY SURGERY FS (CUSTOM PROCEDURE TRAY) ×3 IMPLANT
PENCIL SMOKE EVACUATOR (MISCELLANEOUS) ×3 IMPLANT
PORT APPOLLO RF 90DEGREE MULTI (SURGICAL WAND) ×3 IMPLANT
RESTRAINT HEAD UNIVERSAL NS (MISCELLANEOUS) ×3 IMPLANT
SLEEVE SCD COMPRESS KNEE MED (STOCKING) ×3 IMPLANT
SLING ARM FOAM STRAP LRG (SOFTGOODS) ×3 IMPLANT
SPONGE LAP 18X18 RF (DISPOSABLE) ×6 IMPLANT
STAPLER VISISTAT 35W (STAPLE) IMPLANT
SUCTION FRAZIER HANDLE 10FR (MISCELLANEOUS)
SUCTION TUBE FRAZIER 10FR DISP (MISCELLANEOUS) IMPLANT
SUT ETHILON 2 0 FS 18 (SUTURE) IMPLANT
SUT ETHILON 3 0 PS 1 (SUTURE) IMPLANT
SUT FIBERWIRE #2 38 T-5 BLUE (SUTURE)
SUT MNCRL AB 3-0 PS2 18 (SUTURE) ×3 IMPLANT
SUT MNCRL AB 4-0 PS2 18 (SUTURE) ×3 IMPLANT
SUT MON AB 2-0 CT1 36 (SUTURE) ×3 IMPLANT
SUT RETRIEVER MED (INSTRUMENTS) IMPLANT
SUT TIGER TAPE 7 IN WHITE (SUTURE) IMPLANT
SUT VIC AB 0 CT1 27 (SUTURE)
SUT VIC AB 0 CT1 27XBRD ANBCTR (SUTURE) IMPLANT
SUT VIC AB 0 SH 27 (SUTURE) ×3 IMPLANT
SUT VIC AB 2-0 SH 27 (SUTURE)
SUT VIC AB 2-0 SH 27XBRD (SUTURE) IMPLANT
SUT VIC AB 3-0 SH 27 (SUTURE)
SUT VIC AB 3-0 SH 27X BRD (SUTURE) IMPLANT
SUTURE FIBERWR #2 38 T-5 BLUE (SUTURE) IMPLANT
SUTURE TAPE TIGERLINK 1.3MM BL (SUTURE) ×4 IMPLANT
SUTURETAPE TIGERLINK 1.3MM BL (SUTURE) ×6
SYR BULB EAR ULCER 3OZ GRN STR (SYRINGE) ×3 IMPLANT
SYSTEM IMPL TENODESIS LNT 2.9 (Orthopedic Implant) ×3 IMPLANT
TAPE FIBER 2MM 7IN #2 BLUE (SUTURE) IMPLANT
TOWEL GREEN STERILE FF (TOWEL DISPOSABLE) ×3 IMPLANT
TUBE CONNECTING 20X1/4 (TUBING) ×6 IMPLANT
TUBING ARTHROSCOPY IRRIG 16FT (MISCELLANEOUS) ×3 IMPLANT
YANKAUER SUCT BULB TIP NO VENT (SUCTIONS) ×3 IMPLANT

## 2020-08-10 NOTE — Anesthesia Procedure Notes (Addendum)
Anesthesia Regional Block: Interscalene brachial plexus block   Pre-Anesthetic Checklist: ,, timeout performed, Correct Patient, Correct Site, Correct Laterality, Correct Procedure, Correct Position, site marked, Risks and benefits discussed,  Surgical consent,  Pre-op evaluation,  At surgeon's request and post-op pain management  Laterality: Right  Prep: chloraprep       Needles:  Injection technique: Single-shot  Needle Type: Echogenic Stimulator Needle     Needle Length: 9cm  Needle Gauge: 21     Additional Needles:   Procedures:,,,, ultrasound used (permanent image in chart),,,,  Narrative:  Start time: 08/10/2020 10:15 AM End time: 08/10/2020 10:25 AM Injection made incrementally with aspirations every 5 mL.  Performed by: Personally  Anesthesiologist: Murvin Natal, MD  Additional Notes: Functioning IV was confirmed and monitors were applied.  A timeout was performed. Sterile prep, hand hygiene and sterile gloves were used. A 19mm 21ga Arrow echogenic stimulator needle was used. Negative aspiration and negative test dose prior to incremental administration of local anesthetic. The patient tolerated the procedure well.  Ultrasound guidance: relevent anatomy identified, needle position confirmed, local anesthetic spread visualized around nerve(s), vascular puncture avoided.  Image printed for medical record.

## 2020-08-10 NOTE — Anesthesia Procedure Notes (Signed)
Procedure Name: Intubation Date/Time: 08/10/2020 11:14 AM Performed by: Maryella Shivers, CRNA Pre-anesthesia Checklist: Patient identified, Emergency Drugs available, Suction available and Patient being monitored Patient Re-evaluated:Patient Re-evaluated prior to induction Oxygen Delivery Method: Circle system utilized Preoxygenation: Pre-oxygenation with 100% oxygen Induction Type: IV induction Ventilation: Mask ventilation without difficulty Laryngoscope Size: Mac and 4 Grade View: Grade I Tube type: Oral Tube size: 8.0 mm Number of attempts: 1 Airway Equipment and Method: Stylet and Oral airway Placement Confirmation: ETT inserted through vocal cords under direct vision,  positive ETCO2 and breath sounds checked- equal and bilateral Secured at: 22 cm Tube secured with: Tape Dental Injury: Teeth and Oropharynx as per pre-operative assessment

## 2020-08-10 NOTE — Anesthesia Postprocedure Evaluation (Signed)
Anesthesia Post Note  Patient: Peter Underwood.  Procedure(s) Performed: SHOULDER ARTHROSCOPY WITH DISTAL CLAVICLE RESECTION (Right Shoulder) SHOULDER ACROMIOPLASTY (Right Shoulder) SHOULDER ARTHROSCOPY WITH ROTATOR CUFF REPAIR (Right ) BICEPS TENODESIS (Right ) IRRIGATION AND DEBRIDEMENT SHOULDER (Right Shoulder) SUBSCAPULARIS REPAIR ARTHROSCOPIC (Right )     Patient location during evaluation: PACU Anesthesia Type: Regional and General Level of consciousness: awake Pain management: pain level controlled Vital Signs Assessment: post-procedure vital signs reviewed and stable Respiratory status: spontaneous breathing, nonlabored ventilation, respiratory function stable and patient connected to nasal cannula oxygen Cardiovascular status: blood pressure returned to baseline and stable Postop Assessment: no apparent nausea or vomiting Anesthetic complications: no   No complications documented.  Last Vitals:  Vitals:   08/10/20 1300 08/10/20 1400  BP: 121/86 125/79  Pulse: 74 71  Resp: 16 16  Temp:  36.5 C  SpO2: 100% 99%    Last Pain:  Vitals:   08/10/20 1400  TempSrc:   PainSc: 0-No pain                 Rhyse Skowron P Eshaal Duby

## 2020-08-10 NOTE — Progress Notes (Signed)
Assisted Dr. Roanna Banning with right, ultrasound guided, interscalene  block. Side rails up, monitors on throughout procedure. See vital signs in flow sheet. Tolerated Procedure well.

## 2020-08-10 NOTE — Transfer of Care (Signed)
Immediate Anesthesia Transfer of Care Note  Patient: Peter Underwood.  Procedure(s) Performed: SHOULDER ARTHROSCOPY WITH DISTAL CLAVICLE RESECTION (Right Shoulder) SHOULDER ACROMIOPLASTY (Right Shoulder) SHOULDER ARTHROSCOPY WITH ROTATOR CUFF REPAIR (Right ) BICEPS TENODESIS (Right ) IRRIGATION AND DEBRIDEMENT SHOULDER (Right Shoulder) SUBSCAPULARIS REPAIR ARTHROSCOPIC (Right )  Patient Location: PACU  Anesthesia Type:GA combined with regional for post-op pain  Level of Consciousness: sedated  Airway & Oxygen Therapy: Patient Spontanous Breathing and Patient connected to face mask oxygen  Post-op Assessment: Report given to RN and Post -op Vital signs reviewed and stable  Post vital signs: Reviewed and stable  Last Vitals:  Vitals Value Taken Time  BP 115/79 08/10/20 1238  Temp    Pulse 67 08/10/20 1240  Resp 14 08/10/20 1240  SpO2 100 % 08/10/20 1240  Vitals shown include unvalidated device data.  Last Pain:  Vitals:   08/10/20 0843  TempSrc: Oral  PainSc: 3       Patients Stated Pain Goal: 3 (18/40/37 5436)  Complications: No complications documented.

## 2020-08-10 NOTE — Interval H&P Note (Signed)
History and Physical Interval Note:  08/10/2020 8:23 AM  Peter Underwood.  has presented today for surgery, with the diagnosis of OSTEOARTHRITIS RIGHT [place].  The various methods of treatment have been discussed with the patient and family. After consideration of risks, benefits and other options for treatment, the patient has consented to  Procedure(s) with comments: SHOULDER ARTHROSCOPY WITH DISTAL CLAVICLE RESECTION (Right) - PRE/POST OP SCALENE SHOULDER ACROMIOPLASTY (Right) SHOULDER ARTHROSCOPY WITH ROTATOR CUFF REPAIR (Right) BICEPS TENODESIS (Right) IRRIGATION AND DEBRIDEMENT SHOULDER (Right) as a surgical intervention.  The patient's history has been reviewed, patient examined, no change in status, stable for surgery.  I have reviewed the patient's chart and labs.  Questions were answered to the patient's satisfaction.     Renette Butters

## 2020-08-10 NOTE — Op Note (Addendum)
08/10/2020  10:42 AM  PATIENT:  Peter Underwood.    PRE-OPERATIVE DIAGNOSIS:  OSTEOARTHRITIS RIGHT SHOULDER,IMPINGEMENT SYNDROME,STRAIN OF MUSCLE AND TENDON OF THE ROTATOR CUFF  POST-OPERATIVE DIAGNOSIS:  Same  PROCEDURE:  SHOULDER ARTHROSCOPY WITH DISTAL CLAVICLE RESECTION, SHOULDER ACROMIOPLASTY, SHOULDER ARTHROSCOPY WITH ROTATOR CUFF REPAIR, BICEPS TENODESIS, IRRIGATION AND DEBRIDEMENT SHOULDER  SURGEON:  Renette Butters, MD  ASSISTANT: Aggie Moats, PA-C, he was present and scrubbed throughout the case, critical for completion in a timely fashion, and for retraction, instrumentation, and closure.   ANESTHESIA:   General  PREOPERATIVE INDICATIONS:  Peter Underwood. is a  60 y.o. male with a diagnosis of OSTEOARTHRITIS RIGHT [place] AND TENDON OF THE ROTATOR CUFF who failed conservative measures and elected for surgical management.    The risks benefits and alternatives were discussed with the patient preoperatively including but not limited to the risks of infection, bleeding, nerve injury, cardiopulmonary complications, the need for revision surgery, among others, and the patient was willing to proceed.  OPERATIVE IMPLANTS: arthrex anchors  OPERATIVE FINDINGS: SS tear and bi tendonitis  BLOOD LOSS: minimal  COMPLICATIONS: none  OPERATIVE PROCEDURE:  Patient was identified in the preoperative holding area and site was marked by me He was transported to the operating theater and placed on the table in beach chair position taking care to pad all bony prominences. After a preincinduction time out anesthesia was induced. The right upper extremity was prepped and draped in normal sterile fashion and a pre-incision timeout was performed. Peter Underwood. received ancef for preoperative antibiotics.   Initially made a posterior arthroscopic portal and inserted the arthroscope into the glenohumeral joint. tour of the joint demonstrated the  above operative findings  I created an anterior portal just lateral to the coracoid under direct visualization using a spinal needle.  I performed an extensive debridement of the scarred synovial tissue, sup labrtum and remaining structures  I performed a Loop and Tak tenodesis.  I used a combination of biter and shaver to release the biceps tendon from the superior labrum and then used the shaver to debride the superior labrum to a smooth rim.  He had torn the superior aspect of the subscap so I placed a fiberloop around this and secured it to the 2.9 pushlock repairing his subscap tendon.  I then introduced the arthroscope into the subacromial space and brought the shaver into the anterior portal. I debrided the bursa for appropriate visualization.  I then performed a subacromial decompression using combination of the shaver ArthroCare and burr using a cutting block technique. As happy with the final elevation of the subacromial space on multiple portal views.   Next I turned my attention to the distal clavicle and through the anterior portal using the bur and shaver I was able to perform a distal clavicle excision. I then switched portals and inserted the arthroscope into the anterior portal and was happy with an appropriate resection of the distal clavicle.  I debrided the rotator cuff tear and performed a repair with a 2x2 dual row   Next I removed all arthroscopic equipment expressed all fluid and closed the portals with a nylon stitch. A sterile dressing was applied the patient was taken the PACU in stable condition.  POST OPERATIVE PLAN: The patient will be in a sling full-time and keep the dressings clean dry and intact. DVT prophylaxis will consist of early ambulation

## 2020-08-10 NOTE — Anesthesia Preprocedure Evaluation (Addendum)
Anesthesia Evaluation  Patient identified by MRN, date of birth, ID band Patient awake    Reviewed: Allergy & Precautions, NPO status , Patient's Chart, lab work & pertinent test results  Airway Mallampati: III  TM Distance: >3 FB Neck ROM: Full    Dental  (+) Missing   Pulmonary former smoker,    Pulmonary exam normal breath sounds clear to auscultation       Cardiovascular hypertension, Pt. on medications Normal cardiovascular exam Rhythm:Regular Rate:Normal  ECG: NSR, rate 82   Neuro/Psych negative neurological ROS  negative psych ROS   GI/Hepatic negative GI ROS, Neg liver ROS,   Endo/Other  negative endocrine ROS  Renal/GU negative Renal ROS     Musculoskeletal negative musculoskeletal ROS (+)   Abdominal (+) + obese,   Peds  Hematology negative hematology ROS (+)   Anesthesia Other Findings OSTEOARTHRITIS RIGHT SHOULDER, IMPINGEMENT SYNDROME,STRAIN OF MUSCLE AND TENDON OF THE ROTATOR CUFF  Reproductive/Obstetrics                            Anesthesia Physical Anesthesia Plan  ASA: II  Anesthesia Plan: General and Regional   Post-op Pain Management: GA combined w/ Regional for post-op pain   Induction: Intravenous  PONV Risk Score and Plan: 2 and Ondansetron, Dexamethasone, Midazolam and Treatment may vary due to age or medical condition  Airway Management Planned: Oral ETT  Additional Equipment:   Intra-op Plan:   Post-operative Plan: Extubation in OR  Informed Consent: I have reviewed the patients History and Physical, chart, labs and discussed the procedure including the risks, benefits and alternatives for the proposed anesthesia with the patient or authorized representative who has indicated his/her understanding and acceptance.     Dental advisory given  Plan Discussed with: CRNA  Anesthesia Plan Comments:        Anesthesia Quick Evaluation

## 2020-08-10 NOTE — Discharge Instructions (Signed)
May take Tylenol and Ibuprofen after 3pm, if needed.    Post Anesthesia Home Care Instructions  Activity: Get plenty of rest for the remainder of the day. A responsible individual must stay with you for 24 hours following the procedure.  For the next 24 hours, DO NOT: -Drive a car -Paediatric nurse -Drink alcoholic beverages -Take any medication unless instructed by your physician -Make any legal decisions or sign important papers.  Meals: Start with liquid foods such as gelatin or soup. Progress to regular foods as tolerated. Avoid greasy, spicy, heavy foods. If nausea and/or vomiting occur, drink only clear liquids until the nausea and/or vomiting subsides. Call your physician if vomiting continues.  Special Instructions/Symptoms: Your throat may feel dry or sore from the anesthesia or the breathing tube placed in your throat during surgery. If this causes discomfort, gargle with warm salt water. The discomfort should disappear within 24 hours.  If you had a scopolamine patch placed behind your ear for the management of post- operative nausea and/or vomiting:  1. The medication in the patch is effective for 72 hours, after which it should be removed.  Wrap patch in a tissue and discard in the trash. Wash hands thoroughly with soap and water. 2. You may remove the patch earlier than 72 hours if you experience unpleasant side effects which may include dry mouth, dizziness or visual disturbances. 3. Avoid touching the patch. Wash your hands with soap and water after contact with the patch.     Regional Anesthesia Blocks  1. Numbness or the inability to move the "blocked" extremity may last from 3-48 hours after placement. The length of time depends on the medication injected and your individual response to the medication. If the numbness is not going away after 48 hours, call your surgeon.  2. The extremity that is blocked will need to be protected until the numbness is gone and the   Strength has returned. Because you cannot feel it, you will need to take extra care to avoid injury. Because it may be weak, you may have difficulty moving it or using it. You may not know what position it is in without looking at it while the block is in effect.  3. For blocks in the legs and feet, returning to weight bearing and walking needs to be done carefully. You will need to wait until the numbness is entirely gone and the strength has returned. You should be able to move your leg and foot normally before you try and bear weight or walk. You will need someone to be with you when you first try to ensure you do not fall and possibly risk injury.  4. Bruising and tenderness at the needle site are common side effects and will resolve in a few days.  5. Persistent numbness or new problems with movement should be communicated to the surgeon or the [place] [phone] [place] [phone]).   Information for Discharge Teaching: EXPAREL (bupivacaine liposome injectable suspension)   Your surgeon or anesthesiologist gave you EXPAREL(bupivacaine) to help control your pain after surgery.   EXPAREL is a local anesthetic that provides pain relief by numbing the tissue around the surgical site.  EXPAREL is designed to release pain medication over time and can control pain for up to 72 hours.  Depending on how you respond to EXPAREL, you may require less pain medication during your recovery.  Possible side effects:  Temporary loss of sensation or ability to move in  the area where bupivacaine was injected.  Nausea, vomiting, constipation  Rarely, numbness and tingling in your mouth or lips, lightheadedness, or anxiety may occur.  Call your doctor right away if you think you may be experiencing any of these sensations, or if you have other questions regarding possible side effects.  Follow all other discharge instructions given to you by your surgeon or  nurse. Eat a healthy diet and drink plenty of water or other fluids.  If you return to the hospital for any reason within 96 hours following the administration of EXPAREL, it is important for health care providers to know that you have received this anesthetic. A teal colored band has been placed on your arm with the date, time and amount of EXPAREL you have received in order to alert and inform your health care providers. Please leave this armband in place for the full 96 hours following administration, and then you may remove the band.

## 2020-08-12 ENCOUNTER — Encounter (HOSPITAL_BASED_OUTPATIENT_CLINIC_OR_DEPARTMENT_OTHER): Payer: Self-pay | Admitting: Orthopedic Surgery

## 2022-01-31 ENCOUNTER — Encounter: Payer: Self-pay | Admitting: *Deleted

## 2022-02-21 ENCOUNTER — Encounter: Payer: Self-pay | Admitting: Urology

## 2022-02-21 ENCOUNTER — Ambulatory Visit (INDEPENDENT_AMBULATORY_CARE_PROVIDER_SITE_OTHER): Payer: No Typology Code available for payment source | Admitting: Urology

## 2022-02-21 VITALS — BP 109/79 | HR 96 | Ht 70.5 in | Wt 197.0 lb

## 2022-02-21 DIAGNOSIS — N138 Other obstructive and reflux uropathy: Secondary | ICD-10-CM

## 2022-02-21 DIAGNOSIS — N486 Induration penis plastica: Secondary | ICD-10-CM | POA: Diagnosis not present

## 2022-02-21 DIAGNOSIS — N401 Enlarged prostate with lower urinary tract symptoms: Secondary | ICD-10-CM

## 2022-02-21 NOTE — Progress Notes (Signed)
Assessment: 1. Peyronie's disease   2. BPH with obstruction/lower urinary tract symptoms     Plan: I reviewed records from the patient's PCP. Diagnosis and management of Peyronie's disease discussed with the patient today.  Options for management including observation, intralesional injections with Xiaflex, and surgical management with plication/grafting discussed.  Information was provided to the patient today.  He would like to review the information and will contact the office if he is interested in proceeding with any treatment. Recommend continuing tamsulosin for his BPH with lower urinary tract symptoms. Return to office prn  Schedule with Dr. Alyson Ingles to discuss Xiaflex if patient interested  Chief Complaint:  Chief Complaint  Patient presents with   Abnormal Penile Curvature    History of Present Illness:  Peter Underwood. is a 61 y.o. male who is seen in consultation from Lavella Lemons, [place] for evaluation of Peyronie's disease and BPH with obstruction. He has a less than 1 year history of a dorsal penile curvature.  This is approximately 30 degrees.  No pain associated with erections.  He is able to achieve an adequate erection for intercourse and is able to penetrate without difficulty.  He has noted a decrease in rigidity.  He has used sildenafil 40 mg as needed with improvement in his erections.  No problems with ejaculation.  No known history of penile trauma.  He reports that the curve has been fairly stable for the past 3-6 months.  He recently presented with urinary symptoms including urinary hesitancy and postvoid dribbling.  He was treated for possible prostatitis with doxycycline x10 days.  He was also started on tamsulosin 0.4 mg daily. He has noted improvement in his urinary symptoms with tamsulosin.  He does notice a decreased force of stream in the morning.  No dysuria or gross hematuria. No recent PSA results available.  Past Medical History:  Past Medical  History:  Diagnosis Date   Hypertension     Past Surgical History:  Past Surgical History:  Procedure Laterality Date   APPENDECTOMY     BICEPT TENODESIS Right 08/10/2020   Procedure: BICEPS TENODESIS;  Surgeon: Renette Butters, MD;  Location: [place];  Service: Orthopedics;  Laterality: Right;   COLONOSCOPY N/A 03/10/2015   Procedure: COLONOSCOPY;  Surgeon: Rogene Houston, MD;  [facility];  Service: Endoscopy;  Laterality: N/A;  930   EYE SURGERY     IRRIGATION AND DEBRIDEMENT SHOULDER Right 08/10/2020   Procedure: IRRIGATION AND DEBRIDEMENT SHOULDER;  Surgeon: Renette Butters, MD;  Location: [place];  Service: Orthopedics;  Laterality: Right;   OPEN SUBSCAPULARIS REPAIR Right 08/10/2020   Procedure: SUBSCAPULARIS REPAIR ARTHROSCOPIC;  Surgeon: Renette Butters, MD;  Location: [place];  Service: Orthopedics;  Laterality: Right;  arthroscopic    SHOULDER ACROMIOPLASTY Right 08/10/2020   Procedure: SHOULDER ACROMIOPLASTY;  Surgeon: Renette Butters, MD;  Location: [place];  Service: Orthopedics;  Laterality: Right;   SHOULDER ARTHROSCOPY WITH DISTAL CLAVICLE RESECTION Right 08/10/2020   Procedure: SHOULDER ARTHROSCOPY WITH DISTAL CLAVICLE RESECTION;  Surgeon: Renette Butters, MD;  Location: [place];  Service: Orthopedics;  Laterality: Right;  PRE/POST OP SCALENE   SHOULDER ARTHROSCOPY WITH ROTATOR CUFF REPAIR Right 08/10/2020   Procedure: SHOULDER ARTHROSCOPY WITH ROTATOR CUFF REPAIR;  Surgeon: Renette Butters, MD;  Location: [place];  Service: Orthopedics;  Laterality: Right;    Allergies:  No Known Allergies  Family History:  Family History  Problem Relation Age of Onset   Hypertension Mother    Alzheimer's disease Mother    Diabetes Father    Hypertension Father    Arthritis Father    Brain cancer Brother     Social History:  Social History   Tobacco Use    Smoking status: Former   Smokeless tobacco: Current    Types: Nurse, children's Use: Never used  Substance Use Topics   Alcohol use: Yes    Comment: occasional beer   Drug use: No    Review of symptoms:  Constitutional:  Negative for unexplained weight loss, night sweats, fever, chills ENT:  Negative for nose bleeds, sinus pain, painful swallowing CV:  Negative for chest pain, shortness of breath, exercise intolerance, palpitations, loss of consciousness Resp:  Negative for cough, wheezing, shortness of breath GI:  Negative for nausea, vomiting, diarrhea, bloody stools GU:  Positives noted in HPI; otherwise negative for gross hematuria, dysuria, urinary incontinence Neuro:  Negative for seizures, poor balance, limb weakness, slurred speech Psych:  Negative for lack of energy, depression, anxiety Endocrine:  Negative for polydipsia, polyuria, symptoms of hypoglycemia (dizziness, hunger, sweating) Hematologic:  Negative for anemia, purpura, petechia, prolonged or excessive bleeding, use of anticoagulants  Allergic:  Negative for difficulty breathing or choking as a result of exposure to anything; no shellfish allergy; no allergic response (rash/itch) to materials, foods  Physical exam: BP 109/79   Pulse 96   Ht 5' 10.5" (1.791 m)   Wt 197 lb (89.4 kg)   BMI 27.87 kg/m  GENERAL APPEARANCE:  Well appearing, well developed, well nourished, NAD HEENT: Atraumatic, Normocephalic, oropharynx clear. NECK: Supple without lymphadenopathy or thyromegaly. LUNGS: Clear to auscultation bilaterally. HEART: Regular Rate and Rhythm without murmurs, gallops, or rubs. ABDOMEN: Soft, non-tender, No Masses. EXTREMITIES: Moves all extremities well.  Without clubbing, cyanosis, or edema. NEUROLOGIC:  Alert and oriented x 3, normal gait, CN II-XII grossly intact.  MENTAL STATUS:  Appropriate. BACK:  Non-tender to palpation.  No CVAT SKIN:  Warm, dry and intact.   GU: Penis:  plaque  palpated on dorsal shaft; non tender Meatus: Normal Scrotum: normal, no masses Testis: normal without masses bilateral Epididymis: normal Prostate: 40 g, NT, no nodules Rectum: Normal tone,  no masses or tenderness   Results: U/A dipstick negative

## 2022-04-14 ENCOUNTER — Encounter: Payer: Self-pay | Admitting: *Deleted

## 2022-04-14 ENCOUNTER — Ambulatory Visit: Payer: No Typology Code available for payment source | Attending: Internal Medicine | Admitting: Internal Medicine

## 2022-04-14 ENCOUNTER — Encounter: Payer: Self-pay | Admitting: Internal Medicine

## 2022-04-14 VITALS — BP 114/72 | HR 76 | Ht 70.0 in | Wt 199.2 lb

## 2022-04-14 DIAGNOSIS — E785 Hyperlipidemia, unspecified: Secondary | ICD-10-CM | POA: Insufficient documentation

## 2022-04-14 DIAGNOSIS — E7849 Other hyperlipidemia: Secondary | ICD-10-CM

## 2022-04-14 DIAGNOSIS — I10 Essential (primary) hypertension: Secondary | ICD-10-CM | POA: Diagnosis not present

## 2022-04-14 DIAGNOSIS — R9439 Abnormal result of other cardiovascular function study: Secondary | ICD-10-CM | POA: Insufficient documentation

## 2022-04-14 NOTE — Patient Instructions (Signed)
Medication Instructions:  Continue all current medications.  Labwork: none  Testing/Procedures: Your physician has requested that you have an echocardiogram. Echocardiography is a painless test that uses sound waves to create images of your heart. It provides your doctor with information about the size and shape of your heart and how well your heart's chambers and valves are working. This procedure takes approximately one hour. There are no restrictions for this procedure. Please do NOT wear cologne, perfume, aftershave, or lotions (deodorant is allowed). Please arrive 15 minutes prior to your appointment time. Office will contact with results via phone, letter or mychart.     Follow-Up: Your physician wants you to follow up in:  1 year.  You should receive a call from the office when due.  If you don't receive this call, please call our office to schedule the follow up appointment    Any Other Special Instructions Will Be Listed Below (If Applicable).   If you need a refill on your cardiac medications before your next appointment, please call your pharmacy.

## 2022-04-14 NOTE — Progress Notes (Signed)
Cardiology Office Note  Date: 04/14/2022   ID: Peter Clines., DOB [birth year], MRN [id number]  PCP:  Lavella Lemons, PA  Cardiologist:  None Electrophysiologist:  None   Reason for Office Visit: Follow-up of HTN and CAD   History of Present Illness: Peter Difrancesco. is a 61 y.o. male known to have positive stress test in 2019 (no LHC), HTN, DM 2, HLD presented to cardiology clinic for follow-up visit.  Patient had a nuclear stress test in 2019, due to presyncope and palpitations, that demonstrated a hypertensive response to exercise. There was a defect in the mid inferior and apical inferior locations consistent with a small prior MI with moderate peri-infarct ischemia, LVEF 48%.  It was deemed to be an intermediate risk study. Plan was to perform LHC if patient has medically refractory chest pain or DOE. But patient denied having angina or DOE ever. in addition, for HLD, he did not tolerate atorvastatin. He is here today for follow-up visit.  No interval ER visits or hospitalizations. Patient denied any rest or exertional chest discomfort, tightness, heaviness or pressure, rest or exertional dyspnea, palpitations, light-headedness, syncope and LE swelling. Compliant with medications and no side-effects. No bleeding complications. Denied smoking cigarettes, alcohol use or illicit drug abuse.  Patient was told he had gastric ulcers in the past (but never had EGD) and was told not to take aspirin. His PCP informed him that his cholesterol levels are good and hence not on statin.  Past Medical History:  Diagnosis Date   Hypertension     Past Surgical History:  Procedure Laterality Date   APPENDECTOMY     BICEPT TENODESIS Right 08/10/2020   Procedure: BICEPS TENODESIS;  Surgeon: Renette Butters, MD;  Location: [facility];  Service: Orthopedics;  Laterality: Right;   COLONOSCOPY N/A 03/10/2015   Procedure: COLONOSCOPY;  Surgeon: Rogene Houston, MD;  [facility];  Service: Endoscopy;  Laterality: N/A;  930   EYE SURGERY     IRRIGATION AND DEBRIDEMENT SHOULDER Right 08/10/2020   Procedure: IRRIGATION AND DEBRIDEMENT SHOULDER;  Surgeon: Renette Butters, MD;  Location: [facility];  Service: Orthopedics;  Laterality: Right;   OPEN SUBSCAPULARIS REPAIR Right 08/10/2020   Procedure: SUBSCAPULARIS REPAIR ARTHROSCOPIC;  Surgeon: Renette Butters, MD;  Location: [place];  Service: Orthopedics;  Laterality: Right;  arthroscopic    SHOULDER ACROMIOPLASTY Right 08/10/2020   Procedure: SHOULDER ACROMIOPLASTY;  Surgeon: Renette Butters, MD;  Location: [place];  Service: Orthopedics;  Laterality: Right;   SHOULDER ARTHROSCOPY WITH DISTAL CLAVICLE RESECTION Right 08/10/2020   Procedure: SHOULDER ARTHROSCOPY WITH DISTAL CLAVICLE RESECTION;  Surgeon: Renette Butters, MD;  Location: [facility];  Service: Orthopedics;  Laterality: Right;  PRE/POST OP SCALENE   SHOULDER ARTHROSCOPY WITH ROTATOR CUFF REPAIR Right 08/10/2020   Procedure: SHOULDER ARTHROSCOPY WITH ROTATOR CUFF REPAIR;  Surgeon: Renette Butters, MD;  Location: [place];  Service: Orthopedics;  Laterality: Right;    Current Outpatient Medications  Medication Sig Dispense Refill   JARDIANCE 10 MG TABS tablet Take 10 mg by mouth daily.     lisinopril (PRINIVIL,ZESTRIL) 10 MG tablet Take 10 mg by mouth daily.  1   Multiple Vitamin (MULTIVITAMIN) tablet Take 1 tablet by mouth daily.     sildenafil (REVATIO) 20 MG tablet Take 20 mg by mouth as needed.     tamsulosin (FLOMAX) 0.4 MG CAPS capsule  SMARTSIG:1 Capsule(s) By Mouth Every Evening     No current facility-administered medications for this visit.   Allergies:  Patient has no known allergies.   Social History: The patient  reports that he has quit smoking. His smokeless tobacco use includes chew. He reports current alcohol use. He reports that he does not use drugs.    Family History: The patient's family history includes Alzheimer's disease in his mother; Arthritis in his father; Brain cancer in his brother; Diabetes in his father; Hypertension in his father and mother.   ROS:  Please see the history of present illness. Otherwise, complete review of systems is positive for none.  All other systems are reviewed and negative.   Physical Exam: VS:  BP 114/72   Pulse 76   Ht '5\' 10"'$  (1.778 m)   Wt 199 lb 3.2 oz (90.4 kg)   SpO2 98%   BMI 28.58 kg/m , BMI Body mass index is 28.58 kg/m.  Wt Readings from Last 3 Encounters:  04/14/22 199 lb 3.2 oz (90.4 kg)  02/21/22 197 lb (89.4 kg)  08/10/20 212 lb 11.9 oz (96.5 kg)    General: Patient appears comfortable at rest. HEENT: Conjunctiva and lids normal, oropharynx clear with moist mucosa. Neck: Supple, no elevated JVP or carotid bruits, no thyromegaly. Lungs: Clear to auscultation, nonlabored breathing at rest. Cardiac: Regular rate and rhythm, no S3 or significant systolic murmur, no pericardial rub. Abdomen: Soft, nontender, no hepatomegaly, bowel sounds present, no guarding or rebound. Extremities: No pitting edema, distal pulses 2+. Skin: Warm and dry. Musculoskeletal: No kyphosis. Neuropsychiatric: Alert and oriented x3, affect grossly appropriate.  ECG:  An ECG dated 04/14/2022 was personally reviewed today and demonstrated:  NSR  Recent Labwork: No results found for requested labs within last 365 days.  No results found for: "CHOL", "TRIG", "HDL", "CHOLHDL", "VLDL", "LDLCALC", "LDLDIRECT"  Other Studies Reviewed Today: Nuclear stress test in 2019 hypertensive response to exercise. There was a defect in the mid inferior and apical inferior locations consistent with a small prior MI with moderate peri-infarct ischemia, LVEF 48%.  Event monitor in 2019 No significant arrhythmias  Assessment and Plan: Patient is a 61 year old M known to have positive stress test in 2019 (no LHC), HTN, DM 2,  HLD presented to cardiology clinic for follow-up visit.  #Positive functional study in 2019 Plan -Patient had presyncope and palpitations for which nuclear stress test was performed in 2019 that turned out to be positive (intermediate risk study). Patient denied having angina or DOE ever. I suspect if this is a false positive study. Instructed patient to watch out for symptoms of chest discomfort/pressure/tightness/heaviness on exertion and DOE. In case he develops chest discomfort in the future, instructed him to let EMS/ER know if he took Viagra in the last 24 hours. Patient voiced understanding. -No indication to be on aspirin 81 mg  #HTN, controlled Plan -Continue lisinopril 10 mg once daily. Obtain 2D Echo.  #HLD, unknown values Plan -Obtain lipid panel records from PCP.  If LDL more than 100, he will need to be started on a statin. He did not tolerate atorvastatin in the past and never tried rosuvastatin.  I have spent a total of 30 minutes with patient reviewing chart , telemetry, EKGs, labs and examining patient as well as establishing an assessment and plan that was discussed with the patient.  > 50% of time was spent in direct patient care.     Medication Adjustments/Labs and Tests Ordered: Current medicines are reviewed  at length with the patient today.  Concerns regarding medicines are outlined above.   Tests Ordered: No orders of the defined types were placed in this encounter.   Medication Changes: No orders of the defined types were placed in this encounter.   Disposition:  Follow up  one year  Signed Marigene Erler Fidel Levy, MD, 04/14/2022 8:06 AM    [facility] [place], [place], [place] [address]

## 2022-04-19 ENCOUNTER — Other Ambulatory Visit: Payer: Self-pay | Admitting: Internal Medicine

## 2022-04-19 DIAGNOSIS — E7849 Other hyperlipidemia: Secondary | ICD-10-CM

## 2022-04-19 DIAGNOSIS — I10 Essential (primary) hypertension: Secondary | ICD-10-CM

## 2022-04-19 DIAGNOSIS — R9439 Abnormal result of other cardiovascular function study: Secondary | ICD-10-CM

## 2022-04-20 ENCOUNTER — Ambulatory Visit: Payer: No Typology Code available for payment source | Attending: Internal Medicine

## 2022-04-20 DIAGNOSIS — R9439 Abnormal result of other cardiovascular function study: Secondary | ICD-10-CM

## 2022-04-25 LAB — ECHOCARDIOGRAM COMPLETE
AR max vel: 2.54 cm2
AV Area VTI: 2.78 cm2
AV Area mean vel: 2.49 cm2
AV Mean grad: 3.8 mmHg
AV Peak grad: 7.7 mmHg
Ao pk vel: 1.39 m/s
Area-P 1/2: 3.27 cm2
Calc EF: 61.4 %
MV M vel: 2.51 m/s
MV Peak grad: 25.2 mmHg
S' Lateral: 2.8 cm
Single Plane A2C EF: 68 %
Single Plane A4C EF: 55.5 %

## 2022-04-26 ENCOUNTER — Telehealth: Payer: Self-pay | Admitting: *Deleted

## 2022-04-26 MED ORDER — ROSUVASTATIN CALCIUM 5 MG PO TABS: 5.0000 mg | ORAL_TABLET | Freq: Every day | ORAL | 1 refills | Status: DC

## 2022-04-26 NOTE — Telephone Encounter (Signed)
Patient informed and verbalized understanding of plan. 

## 2022-04-26 NOTE — Telephone Encounter (Signed)
-----   Message from Chalmers Guest, MD sent at 04/14/2022  2:00 PM EDT ----- LDL 125. Goal less than 100. Start Rosuvastatin '5mg'$  QHS. If patient develops myalgias, he can take every other day and see if myalgias resolve. If myalgias do not resolve, can stop Rosuvastatin at that time.

## 2023-04-23 ENCOUNTER — Ambulatory Visit: Payer: No Typology Code available for payment source | Admitting: Internal Medicine

## 2023-04-24 ENCOUNTER — Ambulatory Visit: Payer: No Typology Code available for payment source | Attending: Nurse Practitioner | Admitting: Nurse Practitioner

## 2023-04-24 ENCOUNTER — Encounter: Payer: Self-pay | Admitting: Nurse Practitioner

## 2023-04-24 VITALS — BP 110/60 | HR 85 | Ht 70.0 in | Wt 192.2 lb

## 2023-04-24 DIAGNOSIS — R9439 Abnormal result of other cardiovascular function study: Secondary | ICD-10-CM

## 2023-04-24 DIAGNOSIS — I10 Essential (primary) hypertension: Secondary | ICD-10-CM | POA: Diagnosis not present

## 2023-04-24 DIAGNOSIS — R079 Chest pain, unspecified: Secondary | ICD-10-CM

## 2023-04-24 DIAGNOSIS — E785 Hyperlipidemia, unspecified: Secondary | ICD-10-CM

## 2023-04-24 MED ORDER — ROSUVASTATIN CALCIUM 5 MG PO TABS: 5.0000 mg | ORAL_TABLET | Freq: Every day | ORAL | 1 refills | Status: AC

## 2023-04-24 NOTE — Progress Notes (Unsigned)
Cardiology Office Note:  .   Date:  04/24/2023 ID:  Peter Underwood., DOB 12/05/1960, MRN [id number] PCP: Peter Newcomer, PA  [facility] Providers Cardiologist:  Peter Bicker, MD    History of Present Illness: .   Peter Underwood. is a 62 y.o. male with a PMH of abnormal stress test, hyperlipidemia, presyncope, palpitations, hypertension, type 2 diabetes, who presents today for 1 year follow-up.  NST in 2019 revealed a defect in mid inferior and apical inferior locations with findings consistent with small prior MI with moderate peri-infarct ischemia, LVEF 40%, study found to be intermediate risk. The plan was to perform a left heart catheterization if patient had refractory chest pain or dyspnea on exertion, however patient denied any symptoms.  Was feels that his past NST was false positive study.  Last seen by Dr. Jenene Underwood on 04/14/2022.  Was overall doing well at that time.  Today he presents for 1 year follow-up.  He states he is doing well.  However, about 2 to 3 weeks ago he states he had an episode where he had brief episode of chest pain and dizziness that was "super quick" in duration, denies any recurrent sinus symptoms.  Does state he is not eating as much as he used to.  Limiting sugar in his diet.  Has had recent labs with his PCP that he provides for review.  These overall look very good. Denies any shortness of breath, palpitations, syncope, presyncope, dizziness, orthopnea, PND, swelling or significant weight changes, acute bleeding, or claudication.  ROS: Negative. See HPI.   Studies Reviewed: Marland Kitchen    EKG:  EKG Interpretation Date/Time:  Tuesday April 24 2023 13:40:39 EST Ventricular Rate:  81 PR Interval:  180 QRS Duration:  92 QT Interval:  350 QTC Calculation: 406 R Axis:   56  Text Interpretation: Normal sinus rhythm Normal ECG When compared with ECG of 09-Aug-2020 12:00, No significant change was found Confirmed by Sharlene Dory [phone])  on 04/24/2023 2:13:23 PM   Echo 04/2022: 1. Left ventricular ejection fraction, by estimation, is 55 to 60%. The  left ventricle has normal function. The left ventricle has no regional  wall motion abnormalities. Left ventricular diastolic parameters were  normal. The average left ventricular  global longitudinal strain is -24.3 %. The global longitudinal strain is  normal.   2. Right ventricular systolic function is normal. The right ventricular  size is normal.   3. The mitral valve is normal in structure. No evidence of mitral valve  regurgitation. No evidence of mitral stenosis.   4. The aortic valve has an indeterminant number of cusps. Aortic valve  regurgitation is not visualized. No aortic stenosis is present.   5. The inferior vena cava is normal in size with greater than 50%  respiratory variability, suggesting right atrial pressure of 3 mmHg.   Comparison(s): Nuclear stress test done 02/22/18 showed an EF of 48%.  Cardiac monitor 04/2018: Sinus rhythm, sinus bradycardia, and sinus tachycardia seen. No significant arrhythmias.  Lexiscan 02/2018: Blood pressure demonstrated a hypertensive response to exercise. There was no ST segment deviation noted during stress. Defect 1: There is a medium defect of moderate severity present in the mid inferior and apical inferior location. Findings consistent with small prior myocardial infarction with moderate peri-infarct ischemia. This is an intermediate risk study. Nuclear stress EF: 48%.  Physical Exam:   VS:  BP 110/60   Pulse 85   Ht 5\' 10"  (1.778 m)  Wt 192 lb 3.2 oz (87.2 kg)   SpO2 97%   BMI 27.58 kg/m    Wt Readings from Last 3 Encounters:  04/24/23 192 lb 3.2 oz (87.2 kg)  04/14/22 199 lb 3.2 oz (90.4 kg)  02/21/22 197 lb (89.4 kg)    GEN: Well nourished, well developed in no acute distress NECK: No JVD; No carotid bruits CARDIAC: S1/S2, RRR, no murmurs, rubs, gallops RESPIRATORY:  Clear to auscultation without  rales, wheezing or rhonchi  ABDOMEN: Soft, non-tender, non-distended EXTREMITIES:  No edema; No deformity   ASSESSMENT AND PLAN: .    Chest pain of uncertain etiology, abnormal stress test One-time limited episode of chest pain about 2 to 3 weeks ago, was brief in duration.  Atypical in etiology, possibly was related to his blood sugar at the time.  No recurrence per his report.  EKG is reassuring today.  No indication for ischemic evaluation at this time.  Did review past NST in 2019 that was considered intermediate risk, findings consistent with small prior MI with moderate peri-infarct ischemia, patient denied angina or DOE at the time, was suspected that this was a false positive study.  Instructed patient that if he were to experience more typical/recurrent symptoms, would have low threshold for arranging CCTA.  Continue current medication regimen.  Heart healthy diet and regular cardiovascular exercise encouraged. Care and ED precautions discussed.  HLD Recent lab work with PCP reviewed and was within normal limits.  LDL 71.  Continue rosuvastatin. Heart healthy diet and regular cardiovascular exercise encouraged. Continue to follow with PCP.  HTN Blood pressure stable. Discussed to monitor BP at home at least 2 hours after medications and sitting for 5-10 minutes.  No medication changes at this time. Heart healthy diet and regular cardiovascular exercise encouraged.    Dispo: Will provide medication refill per his request.  Follow-up with Dr. Jenene Underwood or APP in 1 year or sooner if anything changes.  Signed, Sharlene Dory, NP

## 2023-04-24 NOTE — Patient Instructions (Addendum)
